# Patient Record
Sex: Male | Born: 1990 | Hispanic: Refuse to answer | Marital: Single | Smoking: Current every day smoker
Health system: Southern US, Community
[De-identification: ages and names within clinical notes are randomized; demographics above are authoritative.]

## PROBLEM LIST (undated history)

## (undated) DIAGNOSIS — Z8619 Personal history of other infectious and parasitic diseases: Secondary | ICD-10-CM

## (undated) HISTORY — DX: Personal history of other infectious and parasitic diseases: Z86.19

## (undated) HISTORY — PX: NO PAST SURGERIES: SHX2092

---
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
## 2008-06-18 ENCOUNTER — Ambulatory Visit: Payer: Self-pay | Admitting: Family Medicine

## 2011-01-17 ENCOUNTER — Encounter: Payer: Self-pay | Admitting: Internal Medicine

## 2011-01-17 ENCOUNTER — Other Ambulatory Visit (INDEPENDENT_AMBULATORY_CARE_PROVIDER_SITE_OTHER): Payer: 59

## 2011-01-17 ENCOUNTER — Ambulatory Visit (INDEPENDENT_AMBULATORY_CARE_PROVIDER_SITE_OTHER): Payer: 59 | Admitting: Internal Medicine

## 2011-01-17 VITALS — BP 98/60 | HR 90 | Temp 98.5°F | Ht 70.0 in | Wt 150.1 lb

## 2011-01-17 DIAGNOSIS — R51 Headache: Secondary | ICD-10-CM

## 2011-01-17 DIAGNOSIS — B349 Viral infection, unspecified: Secondary | ICD-10-CM

## 2011-01-17 DIAGNOSIS — IMO0001 Reserved for inherently not codable concepts without codable children: Secondary | ICD-10-CM

## 2011-01-17 DIAGNOSIS — B9789 Other viral agents as the cause of diseases classified elsewhere: Secondary | ICD-10-CM

## 2011-01-17 DIAGNOSIS — R110 Nausea: Secondary | ICD-10-CM

## 2011-01-17 DIAGNOSIS — M791 Myalgia, unspecified site: Secondary | ICD-10-CM

## 2011-01-17 DIAGNOSIS — Z720 Tobacco use: Secondary | ICD-10-CM

## 2011-01-17 DIAGNOSIS — F17200 Nicotine dependence, unspecified, uncomplicated: Secondary | ICD-10-CM

## 2011-01-17 LAB — BASIC METABOLIC PANEL
BUN: 8 mg/dL (ref 6–23)
Calcium: 9.3 mg/dL (ref 8.4–10.5)
Chloride: 104 mEq/L (ref 96–112)
Creatinine, Ser: 1.1 mg/dL (ref 0.4–1.5)
GFR: 89.84 mL/min (ref >60.00)

## 2011-01-17 LAB — CBC WITH DIFFERENTIAL/PLATELET
Basophils Absolute: 0 10*3/uL (ref 0.0–0.1)
Basophils Relative: 0.4 % (ref 0.0–3.0)
Eosinophils Absolute: 0.1 10*3/uL (ref 0.0–0.7)
Hemoglobin: 15.5 g/dL (ref 13.0–17.0)
Lymphocytes Relative: 37 % (ref 12.0–46.0)
Lymphs Abs: 1.6 10*3/uL (ref 0.7–4.0)
MCHC: 34.4 g/dL (ref 30.0–36.0)
MCV: 88.2 fl (ref 78.0–100.0)
Monocytes Absolute: 0.7 10*3/uL (ref 0.1–1.0)
Neutro Abs: 1.9 10*3/uL (ref 1.4–7.7)
RBC: 5.12 Mil/uL (ref 4.22–5.81)
RDW: 12.2 % (ref 11.5–14.6)

## 2011-01-17 LAB — HEPATIC FUNCTION PANEL
ALT: 24 U/L (ref 0–53)
Bilirubin, Direct: 0.1 mg/dL (ref 0.0–0.3)
Total Bilirubin: 0.4 mg/dL (ref 0.3–1.2)

## 2011-01-17 MED ORDER — IBUPROFEN 600 MG PO TABS: 600.0000 mg | ORAL_TABLET | Freq: Four times a day (QID) | ORAL | Status: AC | PRN

## 2011-01-17 NOTE — Patient Instructions (Signed)
It was good to see you today. Your symptoms are consistent with a viral syndrome - will check labs to include [place] and mono testing today. Your results will be called to you after review (48-72hours after test completion). If any changes need to be made, you will be notified at that time. For the aches and headache, use ibuprofen or Tylenol 4x/day as needed and stay hydrated! - Your prescription(s) have been submitted to your pharmacy. Please take as directed and contact our office if you believe you are having problem(s) with the medication(s). If your symptoms become worse or do not resolve in next 2 weeks, please call for further evaluation as needed STOP smoking - it is bad you and your loved ones!

## 2011-01-17 NOTE — Progress Notes (Signed)
Subjective:    Patient ID: Daniel Cantrell, male    DOB: 11-Sep-1990, 20 y.o.   MRN: [id number]  HPI New pt to me and our practice, here to establish care  complains of flu-like symptoms Onset 7-8 days ago associated with mild diffuse headache, myalgias in neck and extremities; also chills/subjective fever and nausea but no vomiting or bowel change Initially associated with sore throat but pharyngitis now resolved Symptom onset has been gradual and worsening for a time period of 1 week. Severity is described as moderate. Course of his symptoms over time is constant and worsening.  Denies chest pain, cough, shortness of breath or abdominal pain - no rash +mosquito exposure in past 2 weeks headache and myalgias improved but not resolved with tylenol or OTC NSAIDs   Past Medical History  Diagnosis Date  . History of chicken pox    Family History  Problem Relation Age of Onset  . Lung cancer Other     grandparent  . Alcohol abuse Mother   . Cancer Maternal Grandmother 80    lung, pancreatic   History  Substance Use Topics  . Smoking status: Current Everyday Smoker  . Smokeless tobacco: Not on file  . Alcohol Use: Yes    Review of Systems  Constitutional: Positive for fatigue.  HENT: Negative for ear pain, facial swelling and tinnitus.   Eyes: Negative for photophobia.  Genitourinary: Negative for dysuria and discharge.  Neurological: Negative for seizures and numbness.  also see HPI above.     Objective:   Physical Exam BP 98/60  Pulse 90  Temp(Src) 98.5 F (36.9 C) (Oral)  Ht 5\' 10"  (1.778 m)  Wt 150 lb 1.9 oz (68.094 kg)  BMI 21.54 kg/m2  SpO2 98%  Constitutional: thin, fit young man. appears well-developed and well-nourished. Fatigued but nontoxic.  HENT: NCAT, TMs B clear without erythema or effusion; OP with mild erythema, no exudate Eyes: PERRL, EOMI, no conjunctivitis or icterus Neck: Normal range of motion, supple, no meningismus - mild R>L anterior LAD.  No JVD present. No thyromegaly or tenderness present.  Cardiovascular: Normal rate, regular rhythm and normal heart sounds.  No murmur heard. no BLE edema. Pulmonary/Chest: Effort normal and breath sounds normal. No respiratory distress. no wheezes.  Abdominal: Soft. Bowel sounds are normal. Patient exhibits no distension. There is no tenderness. No mass or HSM Musculoskeletal: Normal range of motion.  No gross deformities Neurological: he is alert and oriented to person, place, and time. No cranial nerve deficit. Coordination and balance normal. Speech, cognition normal; DTRs 2+ BUE/LE Skin: Skin is warm and dry.  No erythema or ulceration. No rash. Psychiatric: he has a normal mood and affect. behavior is normal. Judgment and thought content normal.   No results found for this basename: WBC, HGB, HCT, PLT, CHOL, TRIG, HDL, LDLDIRECT, ALT, AST, NA, K, CL, CREATININE, BUN, CO2, TSH, PSA, INR, GLUF, HGBA1C, MICROALBUR        Assessment & Plan:  Viral syndrome with myalgias, mild headache and fever - outdoor exposure/mosquitos and seasonal epidemic raises concern for [place] infection -  Also consider mono or other viral infection - lab testing now, symptomatic tx and reassurance offered - hydrate, OTC NSAIDS and supportive care,  pt to call if headache or other symptoms worse in next 2 weeks  Tobacco abuse - 5 minutes today spent counseling patient on unhealthy effects of continued tobacco abuse and encouragement of cessation including medical options available to help the patient quit smoking.

## 2011-01-31 ENCOUNTER — Encounter: Payer: Self-pay | Admitting: Internal Medicine

## 2011-06-22 ENCOUNTER — Other Ambulatory Visit (INDEPENDENT_AMBULATORY_CARE_PROVIDER_SITE_OTHER): Payer: 59

## 2011-06-22 ENCOUNTER — Ambulatory Visit (INDEPENDENT_AMBULATORY_CARE_PROVIDER_SITE_OTHER): Payer: 59 | Admitting: Internal Medicine

## 2011-06-22 ENCOUNTER — Ambulatory Visit (HOSPITAL_COMMUNITY)
Admission: RE | Admit: 2011-06-22 | Discharge: 2011-06-22 | Disposition: A | Discharge status: Home / Self Care | Payer: 59 | Source: Ambulatory Visit | Attending: Internal Medicine | Admitting: Internal Medicine

## 2011-06-22 ENCOUNTER — Encounter: Payer: Self-pay | Admitting: Internal Medicine

## 2011-06-22 VITALS — BP 92/70 | HR 88 | Temp 97.6°F | Resp 16 | Wt 141.5 lb

## 2011-06-22 DIAGNOSIS — R1031 Right lower quadrant pain: Secondary | ICD-10-CM | POA: Insufficient documentation

## 2011-06-22 DIAGNOSIS — R112 Nausea with vomiting, unspecified: Secondary | ICD-10-CM

## 2011-06-22 LAB — CBC WITH DIFFERENTIAL/PLATELET
Basophils Relative: 0.3 % (ref 0.0–3.0)
Eosinophils Relative: 7.5 % — ABNORMAL HIGH (ref 0.0–5.0)
Hemoglobin: 16.3 g/dL (ref 13.0–17.0)
Lymphocytes Relative: 28.4 % (ref 12.0–46.0)
Neutro Abs: 3.8 10*3/uL (ref 1.4–7.7)
Neutrophils Relative %: 55.7 % (ref 43.0–77.0)
RBC: 5.38 Mil/uL (ref 4.22–5.81)
WBC: 6.9 10*3/uL (ref 4.5–10.5)

## 2011-06-22 LAB — BASIC METABOLIC PANEL
CO2: 28 mEq/L (ref 19–32)
Calcium: 9.6 mg/dL (ref 8.4–10.5)
Chloride: 104 mEq/L (ref 96–112)
Creatinine, Ser: 0.9 mg/dL (ref 0.4–1.5)
Sodium: 140 mEq/L (ref 135–145)

## 2011-06-22 LAB — HEPATIC FUNCTION PANEL
Albumin: 4.7 g/dL (ref 3.5–5.2)
Alkaline Phosphatase: 77 U/L (ref 39–117)
Total Protein: 7.4 g/dL (ref 6.0–8.3)

## 2011-06-22 MED ORDER — IOHEXOL 300 MG/ML  SOLN
80.0000 mL | Freq: Once | INTRAMUSCULAR | Status: AC | PRN
  Administered 2011-06-22: 80 mL via INTRAVENOUS

## 2011-06-22 MED ORDER — PROMETHAZINE HCL 25 MG PO TABS: 25.0000 mg | ORAL_TABLET | Freq: Four times a day (QID) | ORAL | Status: AC | PRN

## 2011-06-22 NOTE — Progress Notes (Signed)
  Subjective:    Patient ID: Daniel Cantrell, male    DOB: March 24, 1991, 21 y.o.   MRN: [id number]  HPI complains of RLQ pain Associated with nonstop nausea vomiting, poor oral tolerance Low-grade fever, no shaking chills Onset 3-4 days ago, unimproved since onset Initially diarrhea, no bowel movement in last 48 hours Sick contacts with viral URI but no gastroenteritis   Past Medical History  Diagnosis Date  . History of chicken pox     Review of Systems  Constitutional: Negative for unexpected weight change.  Respiratory: Negative for cough and shortness of breath.   Cardiovascular: Negative for chest pain and palpitations.  Gastrointestinal: Positive for nausea, vomiting and abdominal pain. Negative for diarrhea, constipation, blood in stool and abdominal distention.       Objective:   Physical Exam BP 92/70  Pulse 88  Temp(Src) 97.6 F (36.4 C) (Oral)  Resp 16  Wt 141 lb 8 oz (64.184 kg)  SpO2 98% Gen: Uncomfortable, thin young man - nontoxic but restless, question mild colic Lungs: Clear to auscultation bilaterally no increased work of breathing Or avascular: Regular rate and rhythm, no edema Abdomen: Flat, soft mildly tender over right side of the abdomen especially RLQ - hypoactive bowel sounds but present throughout, no rebound or guarding  Lab Results  Component Value Date   WBC 4.4* 01/17/2011   HGB 15.5 01/17/2011   HCT 45.1 01/17/2011   PLT 140.0* 01/17/2011   GLUCOSE 114* 01/17/2011   ALT 24 01/17/2011   AST 30 01/17/2011   NA 141 01/17/2011   K 4.2 01/17/2011   CL 104 01/17/2011   CREATININE 1.1 01/17/2011   BUN 8 01/17/2011   CO2 29 01/17/2011      Assessment & Plan:  RLQ pain with nausea and vomiting - ongoing greater than 72 hours -  Exam without acute abdomen - vital signs stable check labs, check CT abdomen pelvis rule out appendicitis and use Phenergan as needed for symptomatic control -

## 2011-06-22 NOTE — Patient Instructions (Signed)
It was good to see you today. Test(s) ordered today. Your results will be called to you after review (48-72hours after test completion). If any changes need to be made, you will be notified at that time. we'll make referral for CT scan to look for appendicitis today . Our office will contact you regarding appointment(s) once made. Use Phenergan as needed for nausea symptoms - Your prescription(s) have been submitted to your pharmacy. Please take as directed and contact our office if you believe you are having problem(s) with the medication(s). B.R.A.T. Diet Your doctor has recommended the B.R.A.T. diet for you or your child until the condition improves. This is often used to help control diarrhea and vomiting symptoms. If you or your child can tolerate clear liquids, you may have:  Bananas.     Rice.    Applesauce.    Toast (and other simple starches such as crackers, potatoes, noodles).  Be sure to avoid dairy products, meats, and fatty foods until symptoms are better. Fruit juices such as apple, grape, and prune juice can make diarrhea worse. Avoid these. Continue this diet for 2 days or as instructed by your caregiver. Document Released: 05/14/2005 Document Revised: 01/24/2011 Document Reviewed: 10/31/2006 [facility] Patient Information 2012 [place], [place].

## 2011-06-25 ENCOUNTER — Telehealth: Payer: Self-pay

## 2011-06-25 NOTE — Telephone Encounter (Signed)
Margaret Pyle, CMA 06/25/2011 8:52 AM Signed  [facility]  Triage Call Report  Triage Record Num: [id number] Operator: Amy Head  Patient Name: Daniel Cantrell Call Date & Time: 06/22/2011 11:01:15PM  Patient Phone: [phone] PCP: Rene Paci  Patient Gender: Male PCP Fax : [phone]  Patient DOB: May 16, 1991 Practice Name: [facility]  Reason for Call:  Caller: [place]/Mother; PCP: Rene Paci; CB#: [phone]; Call Reason: Abd  Pain, Onset 3 days aog and has gotten worse. Was seen in office today by Dr. Roseanna Rainbow. CT of  abd and pelvis with contrast and blood work done in office, but Have Not Gotten Results Yet.  Pain Getting Worse; Has had diarrhea X 1. Pain is to RLQ. Sx Onset: 06/22/2011; Sx Notes:  Walks bent over holding abd. Loss of appetite. ; Afebrile; Wt: ; Guideline Used: Abd Pain ;  Disp: ED; Appt Scheduled?: NO  Protocol(s) Used: Abdominal Pain  Recommended Outcome per Protocol: See ED Immediately  Reason for Outcome: Abdominal pain that has steadily worsened over  hours OR has been continuous for 3 hours or  more AND any of the following: loss of appetite,  vomiting starting after pain, any fever, OR unable  to carry out normal activities  Care Advice:  ~ Another adult should drive.  ~ Pain medication or laxatives should not be taken until symptoms are evaluated.  ~ Do not eat or drink anything until evaluated by provider.  Call EMS 911 if signs and symptoms of shock develop (such as unable to stand due to faintness, dizziness, or  lightheadedness; new onset of confusion; slow to respond or difficult to awaken; skin is pale, gray, cool, or moist to  touch; severe weakness; loss of consciousness).  ~  ~ IMMEDIATE ACTION  Write down provider's name. List or place the following in a bag for transport with the patient: current prescription  and/or nonprescription medications; alternative treatments, therapies and medications; and street drugs.  ~    06/22/2011 11:11:15PM Page 1 of 1 [email or web address]

## 2011-06-25 NOTE — Telephone Encounter (Signed)
Left message on machine for pt to return my call  

## 2011-06-25 NOTE — Telephone Encounter (Signed)
noted. No emergency room visits made over weekend. Please contact patient - his labs and CT scan were all normal. If continued pain or problems despite Phenergan, b no and I will send other medications. Thanks

## 2011-06-26 NOTE — Telephone Encounter (Signed)
Pt's father advised of results.

## 2012-03-17 ENCOUNTER — Telehealth (HOSPITAL_COMMUNITY): Payer: Self-pay | Admitting: Psychiatry

## 2012-03-18 NOTE — Telephone Encounter (Signed)
Erroneous encounter

## 2013-01-21 ENCOUNTER — Encounter: Payer: Self-pay | Admitting: Internal Medicine

## 2013-01-21 ENCOUNTER — Ambulatory Visit (INDEPENDENT_AMBULATORY_CARE_PROVIDER_SITE_OTHER): Payer: 59 | Admitting: Internal Medicine

## 2013-01-21 VITALS — BP 120/84 | HR 109 | Temp 98.6°F | Wt 154.0 lb

## 2013-01-21 DIAGNOSIS — R110 Nausea: Secondary | ICD-10-CM

## 2013-01-21 DIAGNOSIS — R109 Unspecified abdominal pain: Secondary | ICD-10-CM

## 2013-01-21 DIAGNOSIS — L290 Pruritus ani: Secondary | ICD-10-CM

## 2013-01-21 DIAGNOSIS — B80 Enterobiasis: Secondary | ICD-10-CM

## 2013-01-21 MED ORDER — ALBENDAZOLE 200 MG PO TABS: ORAL_TABLET | ORAL | Status: DC

## 2013-01-21 NOTE — Progress Notes (Signed)
Subjective:    Patient ID: Daniel Cantrell, male    DOB: 20-May-1991, 22 y.o.   MRN: [id number]  HPI  Pt presents to the clinic today with c/o nausea and abdominal pain x 4 days. He has had trouble eating and sleeping due to the pain. The pain is not localized it is just generalized. He denies fever, chills, body aches, vomiting or diarrhea. He does report the he also has anal itching and at one time, he did pull a very thin 1/2 inch white worm out of his anus. He has never had anything like this before. He has not traveled to any 3rd world country. He has not drinking unpurified water that he is aware of.  Review of Systems  Past Medical History  Diagnosis Date  . History of chicken pox     No current outpatient prescriptions on file.   No current facility-administered medications for this visit.    No Known Allergies  Family History  Problem Relation Age of Onset  . Lung cancer Other     grandparent  . Alcohol abuse Mother   . Cancer Maternal Grandmother 80    lung, pancreatic    History   Social History  . Marital Status: Single    Spouse Name: N/A    Number of Children: N/A  . Years of Education: N/A   Occupational History  . Not on file.   Social History Main Topics  . Smoking status: Current Every Day Smoker  . Smokeless tobacco: Not on file  . Alcohol Use: Yes  . Drug Use: No  . Sexual Activity: Not on file   Other Topics Concern  . Not on file   Social History Narrative  . No narrative on file     Constitutional: Denies fever, malaise, fatigue, headache or abrupt weight changes.  Gastrointestinal: Pt reports anal itching, nausea and abdominal pain. Denies  bloating, constipation, diarrhea or blood in the stool.  Skin: Denies redness, rashes, lesions or ulcercations.   No other specific complaints in a complete review of systems (except as listed in HPI above).     Objective:   Physical Exam  BP 120/84  Pulse 109  Temp(Src) 98.6 F (37 C)  (Oral)  Wt 154 lb (69.854 kg)  BMI 22.1 kg/m2  SpO2 97% Wt Readings from Last 3 Encounters:  01/21/13 154 lb (69.854 kg)  06/22/11 141 lb 8 oz (64.184 kg)  01/17/11 150 lb 1.9 oz (68.094 kg)    General: Appears his stated age, well developed, well nourished in NAD. Cardiovascular: Normal rate and rhythm. S1,S2 noted.  No murmur, rubs or gallops noted. No JVD or BLE edema. No carotid bruits noted. Pulmonary/Chest: Normal effort and positive vesicular breath sounds. No respiratory distress. No wheezes, rales or ronchi noted.  Abdomen: Soft and nontender. Normal bowel sounds, no bruits noted. No distention or masses noted. Liver, spleen and kidneys non palpable. Anus normal, no s/s of pinworms. Psychiatric: Mood anxious and affect normal. Behavior is normal. Judgment and thought content normal.   EKG:  BMET    Component Value Date/Time   NA 140 06/22/2011 1336   K 4.4 06/22/2011 1336   CL 104 06/22/2011 1336   CO2 28 06/22/2011 1336   GLUCOSE 89 06/22/2011 1336   BUN 10 06/22/2011 1336   CREATININE 0.9 06/22/2011 1336   CALCIUM 9.6 06/22/2011 1336    Lipid Panel  No results found for this basename: chol, trig, hdl, cholhdl, vldl, ldlcalc  CBC    Component Value Date/Time   WBC 6.9 06/22/2011 1336   RBC 5.38 06/22/2011 1336   HGB 16.3 06/22/2011 1336   HCT 47.7 06/22/2011 1336   PLT 171.0 06/22/2011 1336   MCV 88.7 06/22/2011 1336   MCHC 34.3 06/22/2011 1336   RDW 12.3 06/22/2011 1336   LYMPHSABS 1.9 06/22/2011 1336   MONOABS 0.6 06/22/2011 1336   EOSABS 0.5 06/22/2011 1336   BASOSABS 0.0 06/22/2011 1336    Hgb A1C No results found for this basename: HGBA1C         Assessment & Plan:   Anal itching, not certain that it is related to pinworms:  Will treat for pinworms as pt is very anxious about this Benadryl as needed at night Tylenol prn for pain  Monitor symptoms, if the persist or worse or you develop diarrhea or fever, RTC.

## 2013-01-21 NOTE — Patient Instructions (Signed)
Pinworms  Your caregiver has diagnosed you as having pinworms. These are common infections of children and less common in adults. Pinworms are a small white worm less one quarter to a half inch in length. They look like a tiny piece of white thread. A person gets pinworms by swallowing the eggs of the worm. These eggs are obtained from contaminated (infected or tainted) food, clothing, toys, or any object that comes in contact with the body and mouth. The eggs hatch in the small bowel (intestine) and quickly develop into adult worms in the large bowel (colon). The male worm develops in the large intestine for about two to four weeks. It lays eggs around the anus during the night. These eggs then contaminate clothing, fingers, bedding, and anything else they come in contact with. The main symptoms (problems) of pinworms are itching around the anus (pruritus ani) at night. Children may also have occasional abdominal (belly) pain, loss of appetite, problems sleeping, and irritability. If you or your child has continual anal itching at night, that is a good sign to consult your caregiver. Just about everybody at some time in their life has acquired pinworms. Getting them has nothing to do with the cleanliness of your household or your personal hygiene. Complications are uncommon.  DIAGNOSIS   Diagnosis can be made by looking at your child's anus at night when the pinworms are laying eggs or by sticking a piece of scotch tape on the anus in the morning. The eggs will stick to the tape. This can be examined by your caregiver who can make a diagnosis by looking at the tape under a microscope. Sometimes several scotch tape swabs will be necessary.   HOME CARE INSTRUCTIONS   · Your caregiver will give you medications. They should be taken as directed. Eggs are easily passed. The whole family often needs treatment even if no symptoms are present. Several treatments may be necessary. A second treatment is usually needed  after two weeks to a month.  · Maintain strict hygiene. Washing hands often and keeping the nails short is helpful. Children often scratch themselves at night in their sleep so the eggs get under the nail. This causes reinfection by hand to mouth contamination.  · Change bedding and clothing daily. These should be washed in hot water and dried. This kills the eggs and stops the life cycle of the worm.  · Pets are not known to carry pinworms.  · An ointment may be used at night for anal itching.  · See your caregiver if problems continue.  Document Released: 05/11/2000 Document Revised: 08/06/2011 Document Reviewed: 05/11/2008  [employer]® Patient Information ©2014 [employer].

## 2013-03-23 ENCOUNTER — Other Ambulatory Visit (INDEPENDENT_AMBULATORY_CARE_PROVIDER_SITE_OTHER): Payer: 59

## 2013-03-23 ENCOUNTER — Ambulatory Visit (INDEPENDENT_AMBULATORY_CARE_PROVIDER_SITE_OTHER): Payer: 59 | Admitting: Internal Medicine

## 2013-03-23 ENCOUNTER — Encounter: Payer: Self-pay | Admitting: Internal Medicine

## 2013-03-23 VITALS — BP 112/62 | HR 86 | Temp 97.8°F | Ht 70.0 in | Wt 158.8 lb

## 2013-03-23 DIAGNOSIS — Z0000 Encounter for general adult medical examination without abnormal findings: Secondary | ICD-10-CM

## 2013-03-23 LAB — CBC WITH DIFFERENTIAL/PLATELET
Basophils Absolute: 0 10*3/uL (ref 0.0–0.1)
Basophils Relative: 0.5 % (ref 0.0–3.0)
Eosinophils Absolute: 0.1 10*3/uL (ref 0.0–0.7)
Eosinophils Relative: 1.2 % (ref 0.0–5.0)
HCT: 44 % (ref 39.0–52.0)
Hemoglobin: 15.3 g/dL (ref 13.0–17.0)
Lymphocytes Relative: 23.4 % (ref 12.0–46.0)
Lymphs Abs: 1.4 10*3/uL (ref 0.7–4.0)
MCHC: 34.7 g/dL (ref 30.0–36.0)
MCV: 88.4 fl (ref 78.0–100.0)
Monocytes Absolute: 0.7 10*3/uL (ref 0.1–1.0)
Monocytes Relative: 10.9 % (ref 3.0–12.0)
Neutro Abs: 3.9 10*3/uL (ref 1.4–7.7)
Neutrophils Relative %: 64 % (ref 43.0–77.0)
Platelets: 181 10*3/uL (ref 150.0–400.0)
RBC: 4.98 Mil/uL (ref 4.22–5.81)
RDW: 12.3 % (ref 11.5–14.6)
WBC: 6.1 10*3/uL (ref 4.5–10.5)

## 2013-03-23 LAB — HEPATIC FUNCTION PANEL
ALT: 19 U/L (ref 0–53)
AST: 25 U/L (ref 0–37)
Albumin: 4.7 g/dL (ref 3.5–5.2)
Alkaline Phosphatase: 73 U/L (ref 39–117)
Bilirubin, Direct: 0.1 mg/dL (ref 0.0–0.3)
Total Bilirubin: 0.7 mg/dL (ref 0.3–1.2)
Total Protein: 7.4 g/dL (ref 6.0–8.3)

## 2013-03-23 LAB — BASIC METABOLIC PANEL
BUN: 8 mg/dL (ref 6–23)
CO2: 29 mEq/L (ref 19–32)
Calcium: 9.1 mg/dL (ref 8.4–10.5)
Glucose, Bld: 107 mg/dL — ABNORMAL HIGH (ref 70–99)
Sodium: 140 mEq/L (ref 135–145)

## 2013-03-23 LAB — URINALYSIS, ROUTINE W REFLEX MICROSCOPIC
Bilirubin Urine: NEGATIVE
Ketones, ur: NEGATIVE
Leukocytes, UA: NEGATIVE
Urobilinogen, UA: 0.2 (ref 0.0–1.0)

## 2013-03-23 LAB — LIPID PANEL
Cholesterol: 172 mg/dL (ref 0–200)
HDL: 51.3 mg/dL
LDL Cholesterol: 98 mg/dL (ref 0–99)
Total CHOL/HDL Ratio: 3
Triglycerides: 114 mg/dL (ref 0.0–149.0)
VLDL: 22.8 mg/dL (ref 0.0–40.0)

## 2013-03-23 LAB — TSH: TSH: 1.62 u[IU]/mL (ref 0.35–5.50)

## 2013-03-23 NOTE — Patient Instructions (Addendum)
It was good to see you today.  Health Maintenance reviewed - all recommended immunizations and age-appropriate screenings are up-to-date. Consider Gardisil for HPV prevention as discussed to reduce risk anal and rectal cancer  Test(s) ordered today. Your results will be released to MyChart (or called to you) after review, usually within 72hours after test completion. If any changes need to be made, you will be notified at that same time.  Medications reviewed and updated, no changes recommended at this time.  Please schedule followup in 2 years, call sooner if problems.  Continue to think about giving up cigarettes! Use nicotine gum, nicotine patches or electronic cigarettes. If you're interested in medication to help you quit, please call  - let me know how I can help!    Health Maintenance, Males A healthy lifestyle and preventative care can promote health and wellness.  Maintain regular health, dental, and eye exams.  Eat a healthy diet. Foods like vegetables, fruits, whole grains, low-fat dairy products, and lean protein foods contain the nutrients you need without too many calories. Decrease your intake of foods high in solid fats, added sugars, and salt. Get information about a proper diet from your caregiver, if necessary.  Regular physical exercise is one of the most important things you can do for your health. Most adults should get at least 150 minutes of moderate-intensity exercise (any activity that increases your heart rate and causes you to sweat) each week. In addition, most adults need muscle-strengthening exercises on 2 or more days a week.   Maintain a healthy weight. The body mass index (BMI) is a screening tool to identify possible weight problems. It provides an estimate of body fat based on height and weight. Your caregiver can help determine your BMI, and can help you achieve or maintain a healthy weight. For adults 20 years and older:  A BMI below 18.5 is considered  underweight.  A BMI of 18.5 to 24.9 is normal.  A BMI of 25 to 29.9 is considered overweight.  A BMI of 30 and above is considered obese.  Maintain normal blood lipids and cholesterol by exercising and minimizing your intake of saturated fat. Eat a balanced diet with plenty of fruits and vegetables. Blood tests for lipids and cholesterol should begin at age 57 and be repeated every 5 years. If your lipid or cholesterol levels are high, you are over 50, or you are a high risk for heart disease, you may need your cholesterol levels checked more frequently.Ongoing high lipid and cholesterol levels should be treated with medicines, if diet and exercise are not effective.  If you smoke, find out from your caregiver how to quit. If you do not use tobacco, do not start.  If you choose to drink alcohol, do not exceed 2 drinks per day. One drink is considered to be 12 ounces (355 mL) of beer, 5 ounces (148 mL) of wine, or 1.5 ounces (44 mL) of liquor.  Avoid use of street drugs. Do not share needles with anyone. Ask for help if you need support or instructions about stopping the use of drugs.  High blood pressure causes heart disease and increases the risk of stroke. Blood pressure should be checked at least every 1 to 2 years. Ongoing high blood pressure should be treated with medicines if weight loss and exercise are not effective.  If you are 90 to 24 years old, ask your caregiver if you should take aspirin to prevent heart disease.  Diabetes screening involves taking  a blood sample to check your fasting blood sugar level. This should be done once every 3 years, after age 54, if you are within normal weight and without risk factors for diabetes. Testing should be considered at a younger age or be carried out more frequently if you are overweight and have at least 1 risk factor for diabetes.  Colorectal cancer can be detected and often prevented. Most routine colorectal cancer screening begins at the  age of 65 and continues through age 31. However, your caregiver may recommend screening at an earlier age if you have risk factors for colon cancer. On a yearly basis, your caregiver may provide home test kits to check for hidden blood in the stool. Use of a small camera at the end of a tube, to directly examine the colon (sigmoidoscopy or colonoscopy), can detect the earliest forms of colorectal cancer. Talk to your caregiver about this at age 20, when routine screening begins. Direct examination of the colon should be repeated every 5 to 10 years through age 46, unless early forms of pre-cancerous polyps or small growths are found.  Hepatitis C blood testing is recommended for all people born from [birth year] through 1967 and any individual with known risks for hepatitis C.  Healthy men should no longer receive prostate-specific antigen (PSA) blood tests as part of routine cancer screening. Consult with your caregiver about prostate cancer screening.  Testicular cancer screening is not recommended for adolescents or adult males who have no symptoms. Screening includes self-exam, caregiver exam, and other screening tests. Consult with your caregiver about any symptoms you have or any concerns you have about testicular cancer.  Practice safe sex. Use condoms and avoid high-risk sexual practices to reduce the spread of sexually transmitted infections (STIs).  Use sunscreen with a sun protection factor (SPF) of 30 or greater. Apply sunscreen liberally and repeatedly throughout the day. You should seek shade when your shadow is shorter than you. Protect yourself by wearing long sleeves, pants, a wide-brimmed hat, and sunglasses year round, whenever you are outdoors.  Notify your caregiver of new moles or changes in moles, especially if there is a change in shape or color. Also notify your caregiver if a mole is larger than the size of a pencil eraser.  A one-time screening for abdominal aortic aneurysm (AAA) and  surgical repair of large AAAs by sound wave imaging (ultrasonography) is recommended for ages 29 to 49 years who are current or former smokers.  Stay current with your immunizations. Document Released: 11/10/2007 Document Revised: 08/06/2011 Document Reviewed: 10/09/2010 [facility] Patient Information 2014 [place], [place]. HPV Vaccine Questions and Answers WHAT IS HUMAN PAPILLOMAVIRUS (HPV)? HPV is a virus that can lead to cervical cancer; vulvar and vaginal cancers; penile cancer; anal cancer and genital warts (warts in the genital areas). More than 1 vaccine is available to help you or your child with protection against HPV. Your caregiver can talk to you about which one might give you the best protection. WHO SHOULD GET THIS VACCINE? The HPV vaccine is most effective when given before the onset of sexual activity.  This vaccine is recommended for girls 8 or 24 years of age. It can be given to girls as young as 24 years old.  HPV vaccine can be given to males, 9 through 24 years of age, to reduce the likelihood of acquiring genital warts.  HPV vaccine can be given to males and females aged 55 through 26 years to prevent anal cancer. HPV vaccine is  not generally recommended after age 80, because most individuals have been exposed to the HPV virus by that age. HOW EFFECTIVE IS THIS VACCINE?  The vaccine is generally effective in preventing cervical; vulvar and vaginal cancers; penile cancer; anal cancer and genital warts caused by 4 types of HPV. The vaccine is less effective in those individuals who are already infected with HPV. This vaccine does not treat existing HPV, genital warts, pre-cancers or cancers. WILL SEXUALLY ACTIVE INDIVIDUALS BENEFIT FROM THE VACCINE? Sexually active individuals may still benefit from the vaccine but may get less benefit due to previous HPV exposure. HOW AND WHEN IS THE VACCINE ADMINISTERED? The vaccine is given in a series of 3 injections (shots) over a 6 month  period in both males and females. The exact timing depends on which specific vaccine your caregiver recommends for you. IS THE HPV VACCINE SAFE?  The federal government has approved the HPV vaccine as safe and effective. This vaccine was tested in both males and females in many countries around the world. The most common side effect is soreness at the injection site. Since the drug became approved, there has been some concern about patients passing out after being vaccinated, which has led to a recommendation of a 15 minute waiting period following vaccination. This practice may decrease the small risk of passing out. Additionally there is a rare risk of anaphylaxis (an allergic reaction) to the vaccine and a risk of a blood clot among individuals with specific risk factors for a blood clot. DOES THIS VACCINE CONTAIN THIMEROSAL OR MERCURY? No. There is no thimerosal or mercury in the HPV vaccine. It is made of proteins from the outer coat of the virus (HPV). There is no infectious material in this vaccine. WILL GIRLS/WOMEN WHO HAVE BEEN VACCINATED STILL NEED CERVICAL CANCER SCREENING? Yes. There are 3 reasons why women will still need regular cervical cancer screening. First, the vaccine will NOT provide protection against all types of HPV that cause cervical cancer. Vaccinated women will still be at risk for some cancers. Second, some women may not get all required doses of the vaccine (or they may not get them at the recommended times). Therefore, they may not get the vaccine's full benefits. Third, women may not get the full benefit of the vaccine if they receive it after they have already acquired any of the 4 types of HPV. WILL THE HPV VACCINE BE COVERED BY INSURANCE PLANS? While some insurance companies may cover the vaccine, others may not. Most large group insurance plans cover the costs of recommended vaccines. WHAT KIND OF GOVERNMENT PROGRAMS MAY BE AVAILABLE TO COVER HPV VACCINE? Federal health  programs such as Vaccines for Children [facility]) will cover the HPV vaccine. The [facility] program provides free vaccines to children and adolescents under 86 years of age, who are either uninsured, Medicaid-eligible, American [place] or [place] Native. There are over 45,000 sites that provide [facility] vaccines including hospital, private and public clinics. The [facility] program also allows children and adolescents to get VFC vaccines through [facility] or [facility] if their private health insurance does not cover the vaccine. Some states also provide free or low-cost vaccines, at public health clinics, to people without health insurance coverage for vaccines. GENITAL HPV: WHY IS HPV IMPORTANT? Genital HPV is the most common virus transmitted through genital contact, most often during vaginal and anal sex. About 40 types of HPV can infect the genital areas of men and women. While most HPV types  cause no symptoms and go away on their own, some types can cause cervical cancer in women. These types also cause other less common genital cancers, including cancers of the penis, anus, vagina (birth canal), and vulva (area around the opening of the vagina). Other types of HPV can cause genital warts in men and women. HOW COMMON IS HPV?   At least 50% of sexually active people will get HPV at some time in their lives. HPV is most common in young women and men who are in their late teens and early 29s.  Anyone who has ever had genital contact with another person can get HPV. Both men and women can get it and pass it on to their sex partners without realizing it. IS HPV THE SAME THING AS HIV OR HERPES? HPV is NOT the same as HIV or Herpes (Herpes simplex virus or HSV). While these are all viruses that can be sexually transmitted, HIV and HSV do not cause the same symptoms or health problems as HPV. CAN HPV AND ITS ASSOCIATED DISEASES BE TREATED? There is no treatment for HPV. There are treatments for  the health problems that HPV can cause, such as genital warts, cervical cell changes, and cancers of the cervix (lower part of the womb), vulva, vagina and anus.  HOW IS HPV RELATED TO CERVICAL CANCER? Some types of HPV can infect a woman's cervix and cause the cells to change in an abnormal way. Most of the time, HPV goes away on its own. When HPV is gone, the cervical cells go back to normal. Sometimes, HPV does not go away. Instead, it lingers (persists) and continues to change the cells on a woman's cervix. These cell changes can lead to cancer over time if they are not treated. ARE THERE OTHER WAYS TO PREVENT CERVICAL CANCER? Regular Pap tests and follow-up can prevent most, but not all, cases of cervical cancer. Pap tests can detect cell changes (or pre-cancers) in the cervix before they turn into cancer. Pap tests can also detect most, but not all, cervical cancers at an early, curable stage. Most women diagnosed with cervical cancer have either never had a Pap test, or not had a Pap test in the last 5 years. There is also an HPV DNA test available for use with the Pap test as part of cervical cancer screening. This test may be ordered for women over 30 or for women who get an unclear (borderline) Pap test result. While this test can tell if a woman has HPV on her cervix, it cannot tell which types of HPV she has. If the HPV DNA test is negative for HPV DNA, then screening may be done every 3 years. If the HPV DNA test is positive for HPV DNA, then screening should be done every 6 to 12 months. OTHER QUESTIONS ABOUT THE HPV VACCINE WHAT HPV TYPES DOES THE VACCINE PROTECT AGAINST? The HPV vaccine protects against the HPV types that cause most (70%) cervical cancers (types 16 and 18), most (78%) anal cancers (types 16 and 18) and the two HPV types that cause most (90%) genital warts (types 6 and 11). WHAT DOES THE VACCINE NOT PROTECT AGAINST?  Because the vaccine does not protect against all types of  HPV, it will not prevent all cases of cervical cancer, anal cancer, other genital cancers or genital warts. About 30% of cervical cancers are not prevented with vaccination, so it will be important for women to continue screening for cervical cancer (regular Pap  tests). Also, the vaccine does not prevent about 10% of genital warts nor will it prevent other sexually transmitted infections (STIs), including HIV. Therefore, it will still be important for sexually active adults to practice safe sex to reduce exposure to HPV and other STI's. HOW LONG DOES VACCINE PROTECTION LAST? WILL A BOOSTER SHOT BE NEEDED? So far, studies have followed women for 5 years and found that they are still protected. Currently, additional (booster) doses are not recommended. More research is being done to find out how long protection will last, and if a booster vaccine is needed years later.  WHY IS THE HPV VACCINE RECOMMENDED AT SUCH A YOUNG AGE? Ideally, males and females should get the vaccine before they are sexually active since this vaccine is most effective in individuals who have not yet acquired any of the HPV vaccine types. Individuals who have not been infected with any of the 4 types of HPV will get the full benefits of the vaccine.  SHOULD PREGNANT WOMEN BE VACCINATED? The vaccine is not recommended for pregnant women. There has been limited research looking at vaccine safety for pregnant women and their developing fetus. Studies suggest that the vaccine has not caused health problems during pregnancy, nor has it caused health problems for the infant. Pregnant women should complete their pregnancy before getting the vaccine. If a woman finds out she is pregnant after she has started getting the vaccine series, she should complete her pregnancy before finishing the 3 doses. SHOULD BREASTFEEDING MOTHERS BE VACCINATED? Mothers nursing their babies may get the vaccine because the virus is inactivated and will not harm the  mother or baby. WILL INDIVIDUALS BE PROTECTED AGAINST HPV AND RELATED DISEASES, EVEN IF THEY DO NOT GET ALL 3 DOSES? It is not yet known how much protection individuals will get from receiving only 1 or 2 doses of the vaccine. For this reason, it is very important that individuals get all 3 doses of the vaccine. WILL CHILDREN BE REQUIRED TO BE VACCINATED TO ENTER SCHOOL? There are no federal laws that require children or adolescents to get vaccinated. All school entry laws are state laws so they vary from state to state. To find out what vaccines are needed for children or adolescents to enter school in your state, check with your state health department or board of education. ARE THERE OTHER WAYS TO PREVENT HPV? The only sure way to prevent HPV is to abstain from all sexual activity. Sexually active adults can reduce their risk by being in a mutually monogamous relationship with someone who has had no other sex partners. But even individuals with only 1 lifetime sex partner can get HPV, if their partner has had a previous partner with HPV. It is unknown how much protection condoms provide against HPV, since areas that are not covered by a condom can be exposed to the virus. However, condoms may reduce the risk of genital warts and cervical cancer. They can also reduce the risk of HIV and some other sexually transmitted infections (STIs), when used consistently and correctly (all the time and the right way). Document Released: 05/14/2005 Document Revised: 08/06/2011 Document Reviewed: 01/07/2009 [facility] Patient Information 2014 [place], [place].

## 2013-03-23 NOTE — Progress Notes (Signed)
  Subjective:    Patient ID: Daniel Cantrell, male    DOB: 1991-01-05, 22 y.o.   MRN: [id number]  HPI  patient is here today for annual physical. Patient feels well and has no complaints.  Past Medical History  Diagnosis Date  . History of chicken pox    Family History  Problem Relation Age of Onset  . Lung cancer Other     grandparent  . Alcohol abuse Mother   . Cancer Maternal Grandmother 80    lung, pancreatic   History  Substance Use Topics  . Smoking status: Current Every Day Smoker  . Smokeless tobacco: Not on file  . Alcohol Use: Yes    Review of Systems  Constitutional: Negative for fever, activity change, appetite change, fatigue and unexpected weight change.  Respiratory: Negative for cough, chest tightness, shortness of breath and wheezing.   Cardiovascular: Negative for chest pain, palpitations and leg swelling.  Neurological: Negative for dizziness, weakness and headaches.  Psychiatric/Behavioral: Negative for dysphoric mood. The patient is not nervous/anxious.   All other systems reviewed and are negative.       Objective:   Physical Exam BP 112/62  Pulse 86  Temp(Src) 97.8 F (36.6 C) (Oral)  Ht 5\' 10"  (1.778 m)  Wt 158 lb 12.8 oz (72.031 kg)  BMI 22.79 kg/m2  SpO2 98% Wt Readings from Last 3 Encounters:  03/23/13 158 lb 12.8 oz (72.031 kg)  01/21/13 154 lb (69.854 kg)  06/22/11 141 lb 8 oz (64.184 kg)   Constitutional: he is trim and fit, appears well-developed and well-nourished. No distress.  HENT: Head: Normocephalic and atraumatic. Ears: B TMs ok, no erythema or effusion; Nose: Nose normal. Mouth/Throat: Oropharynx is clear and moist. No oropharyngeal exudate.  Eyes: Conjunctivae and EOM are normal. Pupils are equal, round, and reactive to light. No scleral icterus.  Neck: Normal range of motion. Neck supple. No JVD present. No thyromegaly present.  Cardiovascular: Normal rate, regular rhythm and normal heart sounds.  No murmur heard. No BLE  edema. Pulmonary/Chest: Effort normal and breath sounds normal. No respiratory distress. he has no wheezes.  Abdominal: Soft. Bowel sounds are normal. he exhibits no distension. There is no tenderness. no masses Musculoskeletal: Normal range of motion, no joint effusions. No gross deformities Neurological: he is alert and oriented to person, place, and time. No cranial nerve deficit. Coordination, balance, strength, speech and gait are normal.  Skin: Skin is warm and dry. No rash noted. No erythema.  Psychiatric: he has a normal mood and affect. behavior is normal. Judgment and thought content normal.   Lab Results  Component Value Date   WBC 6.9 06/22/2011   HGB 16.3 06/22/2011   HCT 47.7 06/22/2011   PLT 171.0 06/22/2011   GLUCOSE 89 06/22/2011   ALT 21 06/22/2011   AST 20 06/22/2011   NA 140 06/22/2011   K 4.4 06/22/2011   CL 104 06/22/2011   CREATININE 0.9 06/22/2011   BUN 10 06/22/2011   CO2 28 06/22/2011       Assessment & Plan:   CPX/v70.0 - Patient has been counseled on age-appropriate routine health concerns for screening and prevention. These are reviewed and up-to-date. Immunizations are up-to-date or declined. Labs ordered and reviewed.  Also See problem list. Medications and labs reviewed today.

## 2013-03-24 ENCOUNTER — Encounter: Payer: Self-pay | Admitting: Internal Medicine

## 2013-04-07 IMAGING — CT CT ABD-PELV W/ CM
2 of 4 series · 17 of 46 positions shown, 19 images · IV contrast (APPLIED)
Comparison: None

CLINICAL DATA: Right lower quadrant abdominal and pelvic pain
question appendicitis

CT ABDOMEN AND PELVIS WITH CONTRAST
TECHNIQUE: Multidetector CT imaging of the abdomen and pelvis was
performed following the standard protocol during bolus
administration of intravenous contrast. Sagittal and coronal MPR
images reconstructed from axial data set.
Contrast: 80mL OMNIPAQUE IOHEXOL 300 MG/ML IV SOLN Dilute oral
contrast.

[Series 2: rtn a/p with · axial · 0.74mm/px · z∈[-606,-241]mm · 14 of 81 slices shown, 16 images]
[im 4/81  soft-tissue]
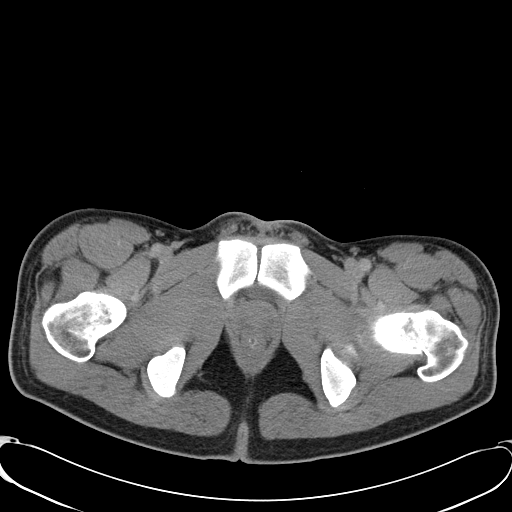
[im 4/81  bone]
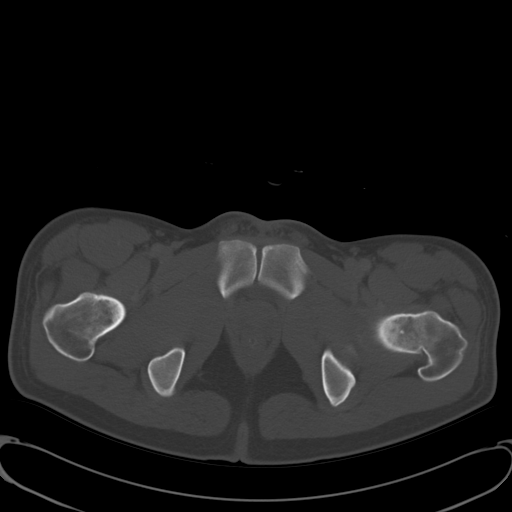
[im 10/81  soft-tissue]
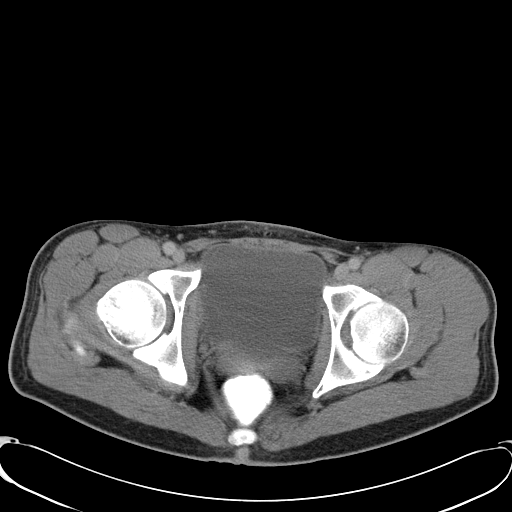
[im 17/81  soft-tissue]
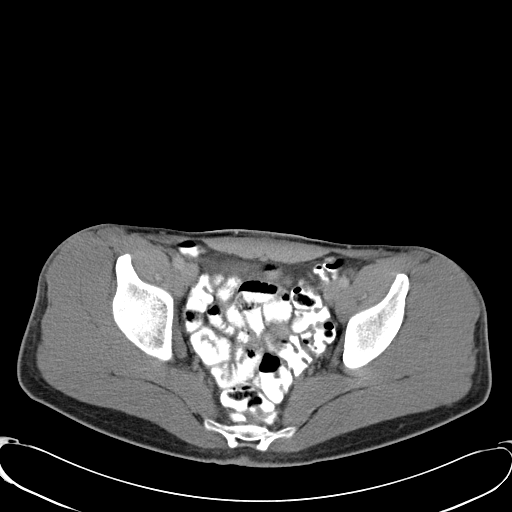
[im 23/81  soft-tissue]
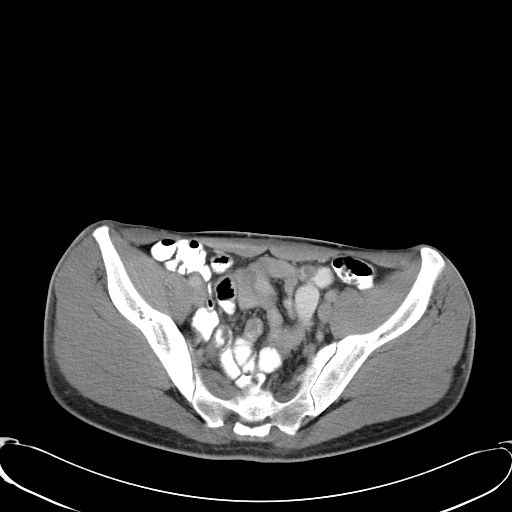
[im 26/81  soft-tissue]
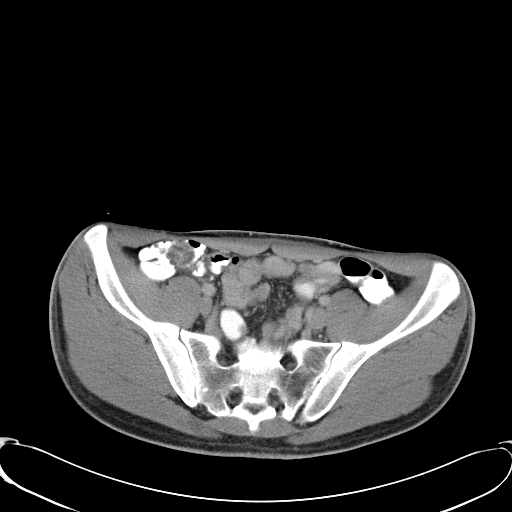
[im 33/81  soft-tissue]
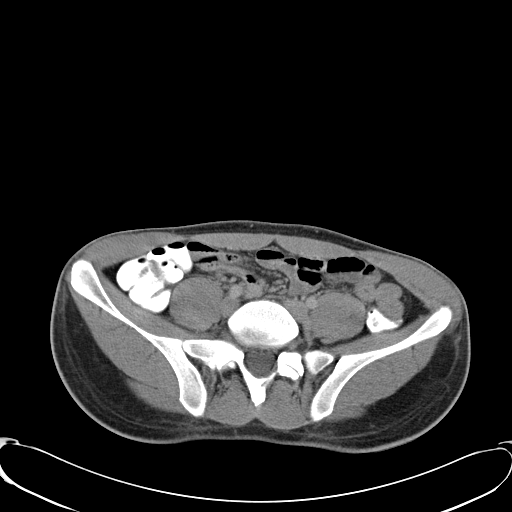
[im 39/81  soft-tissue]
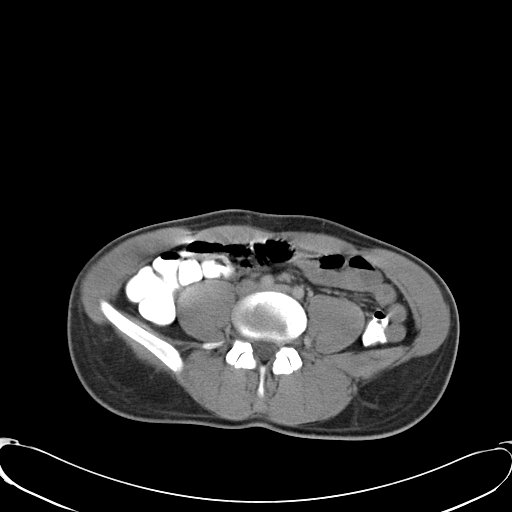
[im 42/81  soft-tissue]
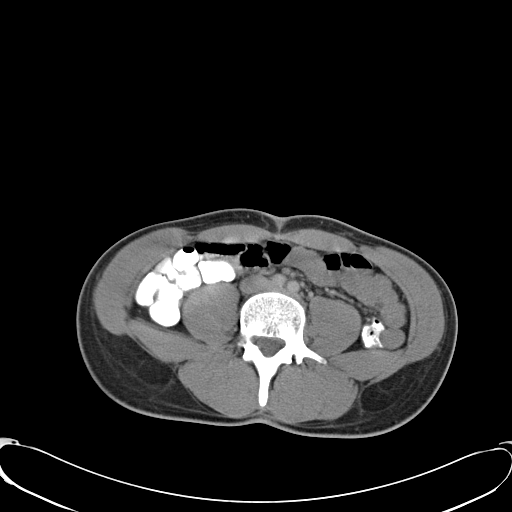
[im 49/81  soft-tissue]
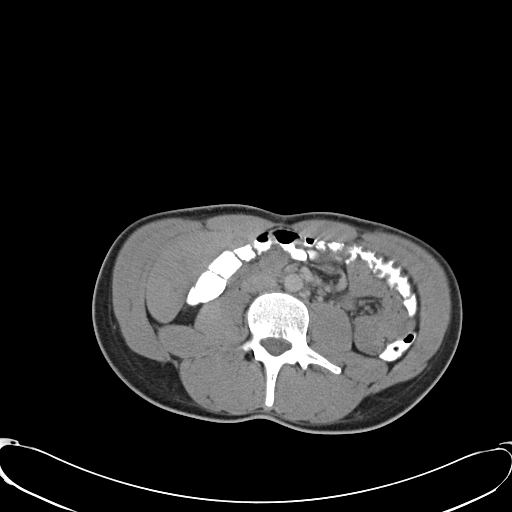
[im 49/81  bone]
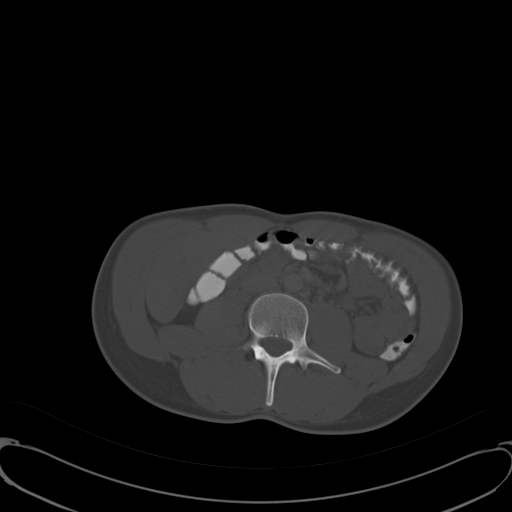
[im 55/81  soft-tissue]
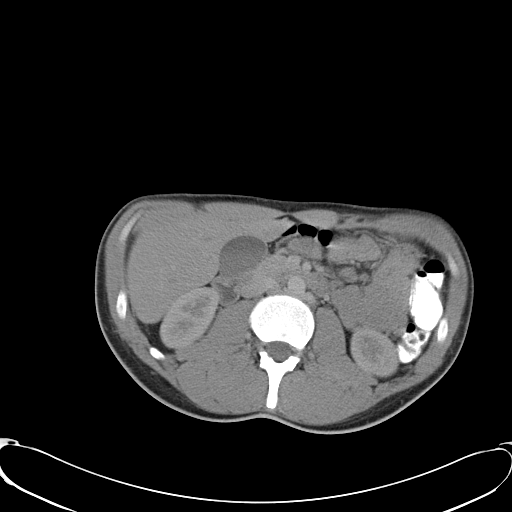
[im 61/81  soft-tissue]
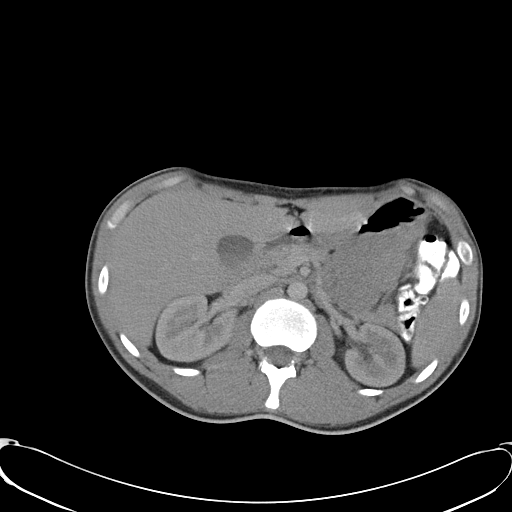
[im 65/81  soft-tissue]
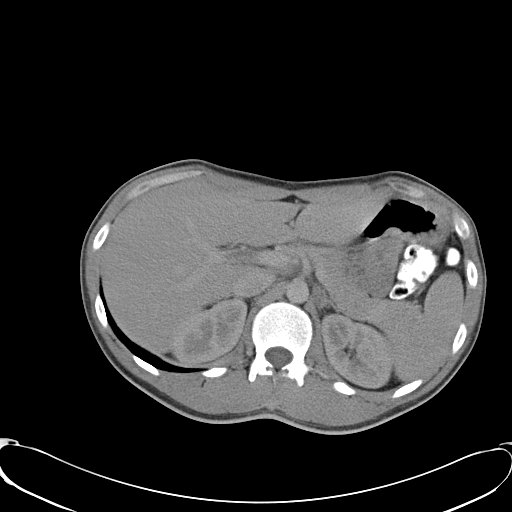
[im 71/81  soft-tissue]
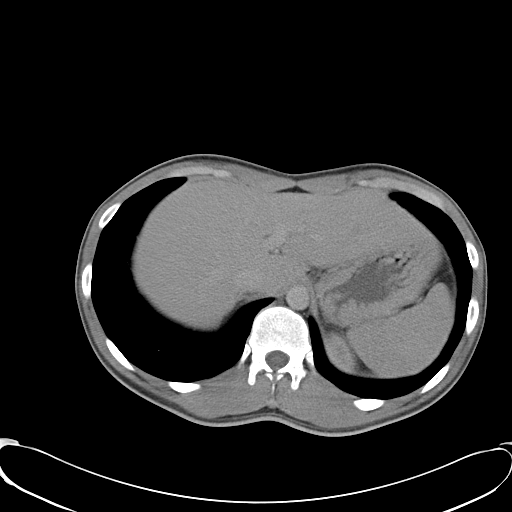
[im 77/81  soft-tissue]
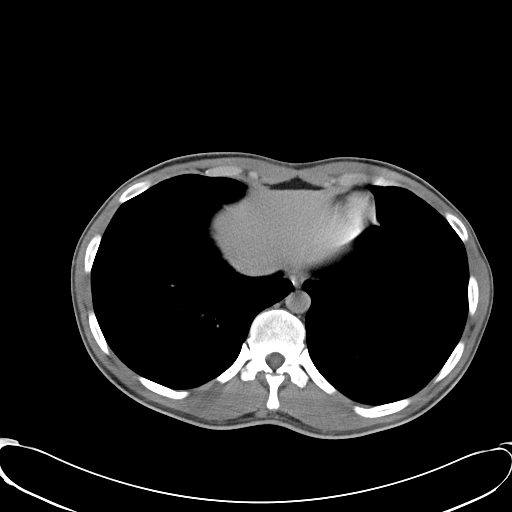

[Series 602: cor · coronal · 0.79mm/px · 3 of 90 slices shown]
[im 30/90  soft-tissue]
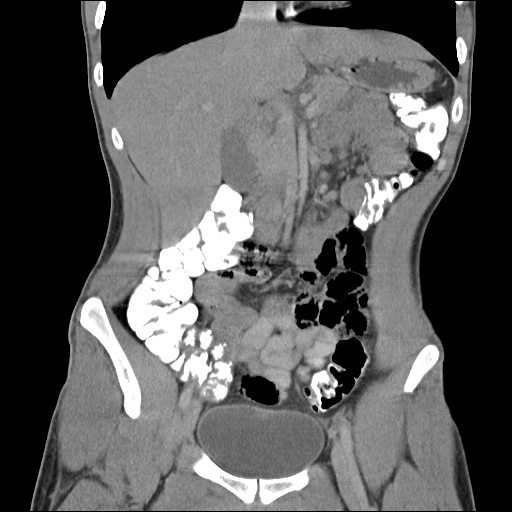
[im 40/90  soft-tissue]
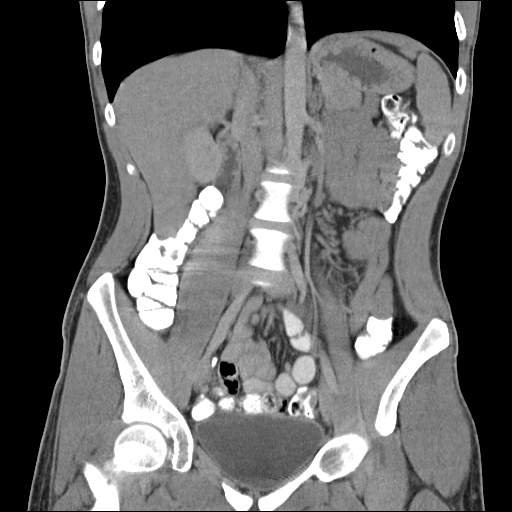
[im 50/90  soft-tissue]
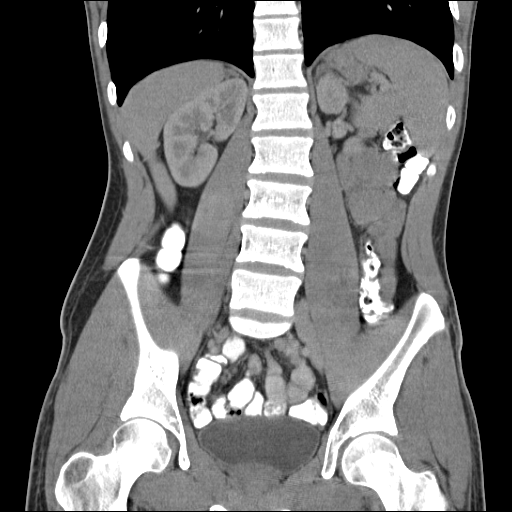

[17 of 46 positions shown; findings below may reference images not displayed]

FINDINGS: Lung bases clear.
Liver, spleen, pancreas, kidneys, and adrenal glands normal
appearance.
Differential attenuation in the gallbladder with slightly higher in
the dependent portions question sludge.
No intrahepatic biliary dilatation.
Normal appendix.
Stomach and bowel loops grossly normal appearance.
Oral contrast has passed to the rectum.
No mass, adenopathy, free fluid, or inflammatory process.
Unremarkable ureters and bladder without evidence of
ureterolithiasis.
Several small pelvic phleboliths noted.
No acute osseous findings.
IMPRESSION: Question sludge within gallbladder.
Otherwise negative exam.

Findings called to Dr. Tiger on 06/22/2011 at 7472 hours.

## 2016-09-01 ENCOUNTER — Ambulatory Visit (HOSPITAL_COMMUNITY)
Admission: EM | Admit: 2016-09-01 | Discharge: 2016-09-01 | Disposition: A | Discharge status: Home / Self Care | Payer: 59 | Attending: Internal Medicine | Admitting: Internal Medicine

## 2016-09-01 ENCOUNTER — Encounter (HOSPITAL_COMMUNITY): Payer: Self-pay | Admitting: Family Medicine

## 2016-09-01 DIAGNOSIS — Z202 Contact with and (suspected) exposure to infections with a predominantly sexual mode of transmission: Secondary | ICD-10-CM | POA: Insufficient documentation

## 2016-09-01 DIAGNOSIS — Z13818 Encounter for screening for other digestive system disorders: Secondary | ICD-10-CM | POA: Diagnosis not present

## 2016-09-01 DIAGNOSIS — F17200 Nicotine dependence, unspecified, uncomplicated: Secondary | ICD-10-CM | POA: Diagnosis not present

## 2016-09-01 DIAGNOSIS — B354 Tinea corporis: Secondary | ICD-10-CM | POA: Insufficient documentation

## 2016-09-01 DIAGNOSIS — Z1159 Encounter for screening for other viral diseases: Secondary | ICD-10-CM

## 2016-09-01 MED ORDER — FLUCONAZOLE 200 MG PO TABS: 200.0000 mg | ORAL_TABLET | Freq: Every day | ORAL | 0 refills | Status: AC

## 2016-09-01 NOTE — ED Triage Notes (Signed)
Pt here for possible exposure to Hep C. sts boyfriend tested positive and he wants to be checked.

## 2016-09-01 NOTE — Discharge Instructions (Addendum)
Test results should be available in the next 2-3 days.  Negative results will be available in MyChart; the urgent care will contact you about positive results.

## 2016-09-02 NOTE — ED Provider Notes (Signed)
[facility]    CSN: [id number] Arrival date & time: 09/01/16  1840     History   Chief Complaint Chief Complaint  Patient presents with  . Exposure to STD    HPI Daniel Cantrell is a 26 y.o. male. presents tonight with request for Hep C testing.  Was contacted by an ex-boyfriend recently who has been diagnosed with Hep C.  Equivocal fatigue.  No jaundice.  Also has several itchy round scaly patches on arms and trunk, not responding yet to otc anti fungal cream.    HPI  Past Medical History:  Diagnosis Date  . History of chicken pox     There are no active problems to display for this patient.   Past Surgical History:  Procedure Laterality Date  . NO PAST SURGERIES         Home Medications    Prior to Admission medications   Medication Sig Start Date End Date Taking? Authorizing Provider  fluconazole (DIFLUCAN) 200 MG tablet Take 1 tablet (200 mg total) by mouth daily. 09/01/16 09/08/16  Eustace Moore, MD    Family History Family History  Problem Relation Age of Onset  . Alcohol abuse Mother   . Cancer Maternal Grandmother 7    lung, pancreatic    Social History Social History  Substance Use Topics  . Smoking status: Current Every Day Smoker  . Smokeless tobacco: Never Used  . Alcohol use Yes     Allergies   Patient has no known allergies.   Review of Systems Review of Systems  All other systems reviewed and are negative.    Physical Exam Triage Vital Signs ED Triage Vitals [09/01/16 1938]  Enc Vitals Group     BP (!) 155/100     Pulse Rate (!) 113     Resp 18     Temp 98.7 F (37.1 C)     Temp Source Oral     SpO2 100 %     Weight      Height      Pain Score      Pain Loc    Updated Vital Signs BP (!) 155/100   Pulse (!) 113   Temp 98.7 F (37.1 C) (Oral)   Resp 18   SpO2 100%   Physical Exam  Constitutional: He is oriented to person, place, and time. No distress.  Alert, nicely groomed  HENT:  Head:  Atraumatic.  Eyes:  Conjugate gaze, no eye redness/drainage  Neck: Neck supple.  Cardiovascular: Normal rate.   Pulmonary/Chest: No respiratory distress.  Abdominal: He exhibits no distension.  Musculoskeletal: Normal range of motion.  Neurological: He is alert and oriented to person, place, and time.  Skin: Skin is warm and dry.  No cyanosis Itchy red patch with scale on right arm, 1.25" across, consistent with tinea.    Nursing note and vitals reviewed.    UC Treatments / Results  Labs Hepatitis panel (acute) ordered  Procedures Procedures (including critical care time) None today  Final Clinical Impressions(s) / UC Diagnoses   Final diagnoses:  Need for hepatitis C screening test  Tinea corporis   Test results should be available in the next 2-3 days.  Negative results will be available in MyChart; the urgent care will contact you about positive results.    New Prescriptions Discharge Medication List as of 09/01/2016  8:51 PM    START taking these medications   Details  fluconazole (DIFLUCAN) 200 MG tablet Take 1  tablet (200 mg total) by mouth daily., Starting Sat 09/01/2016, Until Sat 09/08/2016, Normal         Eustace Moore, MD 09/02/16 [phone]

## 2016-09-03 LAB — HEPATITIS PANEL, ACUTE
HEP A IGM: NEGATIVE
HEP B C IGM: NEGATIVE
HEP B S AG: NEGATIVE

## 2023-04-09 ENCOUNTER — Encounter (HOSPITAL_COMMUNITY): Payer: Self-pay | Admitting: Family Medicine

## 2023-04-29 ENCOUNTER — Other Ambulatory Visit: Payer: Self-pay

## 2023-04-29 ENCOUNTER — Encounter (HOSPITAL_COMMUNITY): Payer: Self-pay | Admitting: *Deleted

## 2023-04-29 ENCOUNTER — Emergency Department (HOSPITAL_COMMUNITY)
Admission: EM | Admit: 2023-04-29 | Discharge: 2023-04-29 | Disposition: A | Discharge status: Home / Self Care | Payer: 59 | Attending: Emergency Medicine | Admitting: Emergency Medicine

## 2023-04-29 DIAGNOSIS — Z2914 Encounter for prophylactic rabies immune globin: Secondary | ICD-10-CM | POA: Diagnosis not present

## 2023-04-29 DIAGNOSIS — Z23 Encounter for immunization: Secondary | ICD-10-CM | POA: Insufficient documentation

## 2023-04-29 DIAGNOSIS — Z203 Contact with and (suspected) exposure to rabies: Secondary | ICD-10-CM | POA: Insufficient documentation

## 2023-04-29 MED ORDER — RABIES IMMUNE GLOBULIN 150 UNIT/ML IM INJ
20.0000 [IU]/kg | INJECTION | Freq: Once | INTRAMUSCULAR | Status: AC
Start: 2023-04-29 — End: 2023-04-29
  Administered 2023-04-29: 1425 [IU]
  Filled 2023-04-29: qty 10

## 2023-04-29 MED ORDER — RABIES VACCINE, PCEC IM SUSR
1.0000 mL | Freq: Once | INTRAMUSCULAR | Status: AC
  Administered 2023-04-29: 1 mL via INTRAMUSCULAR
  Filled 2023-04-29: qty 1

## 2023-04-29 NOTE — ED Provider Notes (Signed)
[facility] Emergency Department Provider Note MRN:  [id number]  Arrival date & time: 04/29/23     Chief Complaint   wants a rabies test   History of Present Illness   Daniel Cantrell is a 32 y.o. year-old male with no pertinent past medical history presenting to the ED with chief complaint of rabies.  Patient becoming more more anxious about rabies.  Explains that he has 3 raccoons living on the roof of his porch.  Has known them since they were young.  Fed them a few times.  Was scratched about a month ago on the hand.  Has had no exposure to the animals since after learning about the risk of rabies.  Keeps thinking about rabies and is worried that he is going to get rabies, here for evaluation.  Denies any fever or symptoms other than some chapped lips recently.  Review of Systems  A thorough review of systems was obtained and all systems are negative except as noted in the HPI and PMH.   Patient's Health History    Past Medical History:  Diagnosis Date   History of chicken pox     Past Surgical History:  Procedure Laterality Date   NO PAST SURGERIES      Family History  Problem Relation Age of Onset   Alcohol abuse Mother    Cancer Maternal Grandmother 3       lung, pancreatic    Social History   Socioeconomic History   Marital status: Single    Spouse name: Not on file   Number of children: Not on file   Years of education: Not on file   Highest education level: Not on file  Occupational History   Not on file  Tobacco Use   Smoking status: Every Day   Smokeless tobacco: Never  Substance and Sexual Activity   Alcohol use: Yes   Drug use: No   Sexual activity: Not on file  Other Topics Concern   Not on file  Social History Narrative   ** Merged History Encounter **       Social Determinants of Health   Financial Resource Strain: Not on file  Food Insecurity: Not on file  Transportation Needs: Not on file  Physical Activity:  Not on file  Stress: Not on file  Social Connections: Not on file  Intimate Partner Violence: Not on file     Physical Exam   Vitals:   04/29/23 0122  BP: (!) 137/93  Pulse: (!) 121  Resp: 16  Temp: 98.9 F (37.2 C)  SpO2: 96%    CONSTITUTIONAL: Well-appearing, NAD NEURO/PSYCH:  Alert and oriented x 3, no focal deficits EYES:  eyes equal and reactive ENT/NECK:  no LAD, no JVD CARDIO: Regular rate, well-perfused, normal S1 and S2 PULM:  CTAB no wheezing or rhonchi GI/GU:  non-distended, non-tender MSK/SPINE:  No gross deformities, no edema SKIN:  no rash, atraumatic   *Additional and/or pertinent findings included in MDM below  Diagnostic and Interventional Summary    EKG Interpretation Date/Time:    Ventricular Rate:    PR Interval:    QRS Duration:    QT Interval:    QTC Calculation:   R Axis:      Text Interpretation:         Labs Reviewed - No data to display  No orders to display    Medications  rabies immune globulin (HYPERRAB/KEDRAB) injection 1,425 Units (has no administration in time range)  rabies vaccine (RABAVERT) injection 1 mL (has no administration in time range)     Procedures  /  Critical Care Procedures  ED Course and Medical Decision Making  Initial Impression and Ddx Patient is anxious likely explaining his tachycardia in triage, on my evaluation his heart rate is in the 90s.  He is not having any symptoms to suggest active rabies.  We discussed the likelihood of rabies, overall felt to be low.  However the incubation period of rabies is on average 2 to 3 months and sometimes over a year and so with his exposure and wound sustained 1 month ago he should be treated.  Past medical/surgical history that increases complexity of ED encounter: None  Interpretation of Diagnostics Laboratory and/or imaging options to aid in the diagnosis/care of the patient were considered.  After careful history and physical examination, it was determined  that there was no indication for diagnostics at this time.  Patient Reassessment and Ultimate Disposition/Management     Discharge  Patient management required discussion with the following services or consulting groups:  None  Complexity of Problems Addressed Acute complicated illness or Injury  Additional Data Reviewed and Analyzed Further history obtained from: None  Additional Factors Impacting ED Encounter Risk None  Elmer Sow. Pilar Plate, MD [facility] Emergency Medicine [facility] [email or web address]  Final Clinical Impressions(s) / ED Diagnoses     ICD-10-CM   1. Need for immunization against rabies  Z23       ED Discharge Orders     None        Discharge Instructions Discussed with and Provided to Patient:    Discharge Instructions      You were evaluated in the Emergency Department and after careful evaluation, we did not find any emergent condition requiring admission or further testing in the hospital.  Your exam/testing today was overall reassuring.  We recommend rabies vaccination.  You will need to complete the vaccine schedule which involves repeat vaccination on December 5, December 9, and December 16.  This can be done in the emergency department or likely an urgent care or the health department.  Please return to the Emergency Department if you experience any worsening of your condition.  Thank you for allowing [place] to be a part of your care.       Sabas Sous, MD 04/29/23 [phone]

## 2023-04-29 NOTE — ED Notes (Signed)
Pt in NAD at d/c from ED. A&O. Ambulatory. Respirations even & unlabored. Skin warm & dry. Pt verbalized understanding of d/c teaching including follow up care, rabies vaccine schedule and reasons to return to the ED. No needs or questions expressed at d/c.

## 2023-04-29 NOTE — Discharge Instructions (Signed)
You were evaluated in the Emergency Department and after careful evaluation, we did not find any emergent condition requiring admission or further testing in the hospital.  Your exam/testing today was overall reassuring.  We recommend rabies vaccination.  You will need to complete the vaccine schedule which involves repeat vaccination on December 5, December 9, and December 16.  This can be done in the emergency department or likely an urgent care or the health department.  Please return to the Emergency Department if you experience any worsening of your condition.  Thank you for allowing [place] to be a part of your care.

## 2023-04-29 NOTE — ED Triage Notes (Signed)
The pt wants a rabies test to see if he has rabies he feeds raccons every day and he has not been bitten  but he heard someone talk about those animals carrying rabies and that was one month ago  he has been having panic attacks about it since then.   He's afraid he has it  no symptoms except panic attacks

## 2023-05-02 ENCOUNTER — Ambulatory Visit (HOSPITAL_COMMUNITY)
Admission: EM | Admit: 2023-05-02 | Discharge: 2023-05-02 | Disposition: A | Discharge status: Home / Self Care | Payer: 59 | Attending: Family Medicine | Admitting: Family Medicine

## 2023-05-02 DIAGNOSIS — Z23 Encounter for immunization: Secondary | ICD-10-CM

## 2023-05-02 DIAGNOSIS — Z203 Contact with and (suspected) exposure to rabies: Secondary | ICD-10-CM

## 2023-05-02 MED ORDER — RABIES VACCINE, PCEC IM SUSR
INTRAMUSCULAR | Status: AC
  Filled 2023-05-02: qty 1

## 2023-05-02 MED ORDER — RABIES VACCINE, PCEC IM SUSR
1.0000 mL | Freq: Once | INTRAMUSCULAR | Status: AC
  Administered 2023-05-02: 1 mL via INTRAMUSCULAR

## 2023-05-02 NOTE — ED Triage Notes (Signed)
Presenting for day 3 rabies vaccine. Denies any reaction to the previous injection. States immunization was given in the left arm last time. Will administer in the right arm this dose.

## 2023-05-02 NOTE — ED Notes (Signed)
Rabies vaccine given in the right deltoid. Tolerated well.

## 2023-05-06 ENCOUNTER — Ambulatory Visit (HOSPITAL_COMMUNITY)
Admission: EM | Admit: 2023-05-06 | Discharge: 2023-05-06 | Disposition: A | Discharge status: Home / Self Care | Payer: 59 | Attending: Emergency Medicine | Admitting: Emergency Medicine

## 2023-05-06 ENCOUNTER — Other Ambulatory Visit: Payer: Self-pay

## 2023-05-06 ENCOUNTER — Encounter (HOSPITAL_COMMUNITY): Payer: Self-pay

## 2023-05-06 DIAGNOSIS — Z203 Contact with and (suspected) exposure to rabies: Secondary | ICD-10-CM

## 2023-05-06 MED ORDER — RABIES VACCINE, PCEC IM SUSR
INTRAMUSCULAR | Status: AC
  Filled 2023-05-06: qty 1

## 2023-05-06 MED ORDER — RABIES VACCINE, PCEC IM SUSR
1.0000 mL | Freq: Once | INTRAMUSCULAR | Status: AC
  Administered 2023-05-06: 1 mL via INTRAMUSCULAR

## 2023-05-06 MED ORDER — RABIES VACCINE, PCEC IM SUSR: 1.0000 mL | Freq: Once | INTRAMUSCULAR | Status: DC

## 2023-05-06 NOTE — ED Triage Notes (Signed)
Pt presents for 3rd rabies shot.

## 2023-05-13 ENCOUNTER — Ambulatory Visit (HOSPITAL_COMMUNITY)
Admission: EM | Admit: 2023-05-13 | Discharge: 2023-05-13 | Disposition: A | Discharge status: Home / Self Care | Payer: 59 | Attending: Internal Medicine | Admitting: Internal Medicine

## 2023-05-13 DIAGNOSIS — Z203 Contact with and (suspected) exposure to rabies: Secondary | ICD-10-CM

## 2023-05-13 MED ORDER — RABIES VACCINE, PCEC IM SUSR
INTRAMUSCULAR | Status: AC
  Filled 2023-05-13: qty 1

## 2023-05-13 MED ORDER — RABIES VACCINE, PCEC IM SUSR
1.0000 mL | Freq: Once | INTRAMUSCULAR | Status: AC
  Administered 2023-05-13: 1 mL via INTRAMUSCULAR

## 2023-07-31 ENCOUNTER — Encounter (HOSPITAL_COMMUNITY): Payer: Self-pay

## 2023-07-31 ENCOUNTER — Ambulatory Visit (HOSPITAL_COMMUNITY)
Admission: EM | Admit: 2023-07-31 | Discharge: 2023-07-31 | Disposition: A | Discharge status: Home / Self Care | Attending: Emergency Medicine | Admitting: Emergency Medicine

## 2023-07-31 DIAGNOSIS — Z113 Encounter for screening for infections with a predominantly sexual mode of transmission: Secondary | ICD-10-CM | POA: Insufficient documentation

## 2023-07-31 DIAGNOSIS — Z789 Other specified health status: Secondary | ICD-10-CM | POA: Diagnosis not present

## 2023-07-31 DIAGNOSIS — R1012 Left upper quadrant pain: Secondary | ICD-10-CM | POA: Insufficient documentation

## 2023-07-31 DIAGNOSIS — R1013 Epigastric pain: Secondary | ICD-10-CM | POA: Diagnosis not present

## 2023-07-31 DIAGNOSIS — R000 Tachycardia, unspecified: Secondary | ICD-10-CM | POA: Insufficient documentation

## 2023-07-31 LAB — POCT URINALYSIS DIP (MANUAL ENTRY)
Blood, UA: NEGATIVE
Glucose, UA: NEGATIVE mg/dL
Nitrite, UA: NEGATIVE
Protein Ur, POC: 100 mg/dL — AB
Spec Grav, UA: 1.025
Urobilinogen, UA: 1 U/dL
pH, UA: 6

## 2023-07-31 LAB — CBC
HCT: 57 % — ABNORMAL HIGH (ref 39.0–52.0)
Hemoglobin: 20.6 g/dL — ABNORMAL HIGH (ref 13.0–17.0)
MCH: 33.6 pg (ref 26.0–34.0)
MCHC: 36.1 g/dL — ABNORMAL HIGH (ref 30.0–36.0)
MCV: 93 fL (ref 80.0–100.0)
Platelets: 154 10*3/uL (ref 150–400)
RBC: 6.13 MIL/uL — ABNORMAL HIGH (ref 4.22–5.81)
RDW: 11.9 % (ref 11.5–15.5)
WBC: 9.2 10*3/uL (ref 4.0–10.5)
nRBC: 0 % (ref 0.0–0.2)

## 2023-07-31 LAB — HIV ANTIBODY (ROUTINE TESTING W REFLEX): HIV Screen 4th Generation wRfx: NONREACTIVE

## 2023-07-31 LAB — BASIC METABOLIC PANEL
Anion gap: 15 (ref 5–15)
BUN: <5 mg/dL — ABNORMAL LOW (ref 6–20)
CO2: 22 mmol/L (ref 22–32)
Calcium: 9.4 mg/dL (ref 8.9–10.3)
Chloride: 102 mmol/L (ref 98–111)
Creatinine, Ser: 0.8 mg/dL (ref 0.61–1.24)
GFR, Estimated: >60 mL/min (ref >60)
Glucose, Bld: 97 mg/dL (ref 70–99)
Potassium: 4.1 mmol/L (ref 3.5–5.1)
Sodium: 139 mmol/L (ref 135–145)

## 2023-07-31 LAB — LIPASE, BLOOD: Lipase: 41 U/L (ref 11–51)

## 2023-07-31 MED ORDER — PANTOPRAZOLE SODIUM 20 MG PO TBEC: 20.0000 mg | DELAYED_RELEASE_TABLET | Freq: Every day | ORAL | 0 refills | Status: AC

## 2023-07-31 MED ORDER — SUCRALFATE 1 G PO TABS: 1.0000 g | ORAL_TABLET | Freq: Three times a day (TID) | ORAL | 0 refills | Status: AC

## 2023-07-31 NOTE — ED Triage Notes (Signed)
 Patient states lower abdominal pain and diarrhea for several days. Denies any other symptoms.

## 2023-07-31 NOTE — ED Provider Notes (Addendum)
 [facility]    CSN: [id number] Arrival date & time: 07/31/23  1639      History   Chief Complaint Chief Complaint  Patient presents with   Abdominal Pain    HPI Daniel Cantrell is a 33 y.o. male.   Patient presents with intermittent left upper quadrant pain and burning.  Patient also endorses intermittent mild diarrhea.  Denies nausea, vomiting, blood in stool, and fever.  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  Patient also reports recent unprotected sexual intercourse and is requesting STD testing.  Denies any known exposures or related symptoms.   Abdominal Pain   Past Medical History:  Diagnosis Date   History of chicken pox     There are no active problems to display for this patient.   Past Surgical History:  Procedure Laterality Date   NO PAST SURGERIES         Home Medications    Prior to Admission medications   Medication Sig Start Date End Date Taking? Authorizing Provider  pantoprazole (PROTONIX) 20 MG tablet Take 1 tablet (20 mg total) by mouth daily. 07/31/23  Yes Susann Givens, Amica Harron A, NP  sucralfate (CARAFATE) 1 g tablet Take 1 tablet (1 g total) by mouth 4 (four) times daily -  with meals and at bedtime. 07/31/23  Yes Letta Kocher, NP    Family History Family History  Problem Relation Age of Onset   Alcohol abuse Mother    Cancer Maternal Grandmother 64       lung, pancreatic    Social History Social History   Tobacco Use   Smoking status: Every Day   Smokeless tobacco: Never  Substance Use Topics   Alcohol use: Yes   Drug use: No     Allergies   Patient has no known allergies.   Review of Systems Review of Systems  Gastrointestinal:  Positive for abdominal pain.   Per HPI  Physical Exam Triage Vital Signs ED Triage Vitals  Encounter Vitals Group     BP 07/31/23 1657 (!) 142/111     Systolic BP Percentile --      Diastolic BP Percentile --      Pulse  Rate 07/31/23 1657 (!) 129     Resp 07/31/23 1657 16     Temp 07/31/23 1657 98.4 F (36.9 C)     Temp Source 07/31/23 1657 Oral     SpO2 07/31/23 1657 94 %     Weight --      Height --      Head Circumference --      Peak Flow --      Pain Score 07/31/23 1658 5     Pain Loc --      Pain Education --      Exclude from Growth Chart --    No data found.  Updated Vital Signs BP (!) 143/104 (BP Location: Right Arm)   Pulse (!) 118   Temp 98.3 F (36.8 C) (Oral)   Resp 16   SpO2 95%   Visual Acuity Right Eye Distance:   Left Eye Distance:   Bilateral Distance:    Right Eye Near:   Left Eye Near:    Bilateral Near:     Physical Exam Vitals and nursing note reviewed.  Constitutional:      General: He is awake. He is not in acute distress.    Appearance: Normal appearance. He is  well-developed and well-groomed. He is not ill-appearing.  Cardiovascular:     Rate and Rhythm: Regular rhythm. Tachycardia present.  Pulmonary:     Effort: Pulmonary effort is normal.     Breath sounds: Normal breath sounds.  Abdominal:     General: Abdomen is flat. Bowel sounds are normal. There is no distension.     Palpations: Abdomen is soft.     Tenderness: There is abdominal tenderness in the epigastric area and left upper quadrant. There is no guarding or rebound.  Skin:    General: Skin is warm and dry.  Neurological:     Mental Status: He is alert and oriented to person, place, and time. Mental status is at baseline.     GCS: GCS eye subscore is 4. GCS verbal subscore is 5. GCS motor subscore is 6.  Psychiatric:        Attention and Perception: Attention and perception normal.        Mood and Affect: Affect normal. Mood is anxious.        Speech: Speech normal.        Behavior: Behavior normal. Behavior is cooperative.        Thought Content: Thought content normal.        Cognition and Memory: Cognition and memory normal.        Judgment: Judgment normal.      UC Treatments /  Results  Labs (all labs ordered are listed, but only abnormal results are displayed) Labs Reviewed  CBC - Abnormal; Notable for the following components:      Result Value   RBC 6.13 (*)    Hemoglobin 20.6 (*)    HCT 57.0 (*)    MCHC 36.1 (*)    All other components within normal limits  POCT URINALYSIS DIP (MANUAL ENTRY) - Abnormal; Notable for the following components:   Color, UA straw (*)    Bilirubin, UA small (*)    Ketones, POC UA small (15) (*)    Protein Ur, POC =100 (*)    Leukocytes, UA Trace (*)    All other components within normal limits  BASIC METABOLIC PANEL  LIPASE, BLOOD  HIV ANTIBODY (ROUTINE TESTING W REFLEX)  RPR  CYTOLOGY, (ORAL, ANAL, URETHRAL) ANCILLARY ONLY    EKG   Radiology No results found.  Procedures Procedures (including critical care time)  Medications Ordered in UC Medications - No data to display  Initial Impression / Assessment and Plan / UC Course  I have reviewed the triage vital signs and the nursing notes.  Pertinent labs & imaging results that were available during my care of the patient were reviewed by me and considered in my medical decision making (see chart for details).     Upon assessment mild left upper quadrant and epigastric tenderness noted upon palpation.  Tachycardia and mild hypertension noted.  Patient is mildly anxious.  Without tremors, headache, hallucinations, nausea, and vomiting.  Alert and oriented x 3.  GCS 15.  Patient reports drinking 8-12 beers per night.  Patient's last drink was last night.  CBC, BMP, and lipase ordered to rule out electrolyte imbalance, dehydration, and/or underlying pancreatitis that could be causing tachycardia and left upper quadrant pain.  GU exam deferred due to lack of symptoms.  Patient perform self swab for STD.  HIV and syphilis testing ordered.  UA ordered per standing orders.  UA did not reveal any obvious signs of infection.  Discussed symptoms of alcohol withdrawal.   Prescribed Protonix  and Carafate to help with abdominal discomfort and for possible underlying ulcer coverage.  Discussed follow-up, return, and strict ER precautions.  Follow-up @ 7:20pm: CBC resulted and showed elevated hemoglobin. Attempted x3 to call patient without answer to inform him of abnormal result and recommend patient be seen in the ER for further evaluation. Attempted to leave VM asking patient to call back, but VM box is full.  Final Clinical Impressions(s) / UC Diagnoses   Final diagnoses:  Left upper quadrant abdominal pain  Screening for STD (sexually transmitted disease)  Alcohol use  Abdominal pain, epigastric  Tachycardia     Discharge Instructions      Start taking Protonix and Carafate as prescribed to help with abdominal discomfort.  As discussed I recommend avoiding alcohol as this could be exacerbating your symptoms.  We have drawn some labs today and these results should come back in the next day and someone will call if there are any concerning results that require immediate treatment.  Your other results will return in the next 2 to 3 days.  Return here if symptoms persist or worsen.  You could also follow-up with [place] [facility] or at the mobile clinic if symptoms persist.  I have attached the mobile clinic schedule to the back of the paperwork.   If you develop worsening abdominal pain, severe weakness, tremors, increased anxiety, excessive vomiting, or seizure-like activity please seek immediate medical treatment in the ER.     ED Prescriptions     Medication Sig Dispense Auth. Provider   pantoprazole (PROTONIX) 20 MG tablet Take 1 tablet (20 mg total) by mouth daily. 30 tablet Wynonia Lawman A, NP   sucralfate (CARAFATE) 1 g tablet Take 1 tablet (1 g total) by mouth 4 (four) times daily -  with meals and at bedtime. 120 tablet Wynonia Lawman A, NP      PDMP not reviewed this encounter.   Letta Kocher,  NP 07/31/23 2014    Letta Kocher, NP 07/31/23 2015    Wynonia Lawman A, NP 08/01/23 [phone]

## 2023-07-31 NOTE — Discharge Instructions (Addendum)
 Start taking Protonix and Carafate as prescribed to help with abdominal discomfort.  As discussed I recommend avoiding alcohol as this could be exacerbating your symptoms.  We have drawn some labs today and these results should come back in the next day and someone will call if there are any concerning results that require immediate treatment.  Your other results will return in the next 2 to 3 days.  Return here if symptoms persist or worsen.  You could also follow-up with [place] [facility] or at the mobile clinic if symptoms persist.  I have attached the mobile clinic schedule to the back of the paperwork.   If you develop worsening abdominal pain, severe weakness, tremors, increased anxiety, excessive vomiting, or seizure-like activity please seek immediate medical treatment in the ER.

## 2023-08-01 LAB — CYTOLOGY, (ORAL, ANAL, URETHRAL) ANCILLARY ONLY
Chlamydia: NEGATIVE
Comment: NEGATIVE
Comment: NEGATIVE
Comment: NORMAL
Neisseria Gonorrhea: NEGATIVE
Trichomonas: NEGATIVE

## 2023-08-01 LAB — RPR: RPR Ser Ql: NONREACTIVE

## 2023-08-02 ENCOUNTER — Emergency Department (HOSPITAL_COMMUNITY)
Admission: EM | Admit: 2023-08-02 | Discharge: 2023-08-02 | Disposition: A | Discharge status: Home / Self Care | Attending: Emergency Medicine | Admitting: Emergency Medicine

## 2023-08-02 ENCOUNTER — Other Ambulatory Visit: Payer: Self-pay

## 2023-08-02 ENCOUNTER — Encounter (HOSPITAL_COMMUNITY): Payer: Self-pay | Admitting: Emergency Medicine

## 2023-08-02 DIAGNOSIS — R790 Abnormal level of blood mineral: Secondary | ICD-10-CM | POA: Diagnosis not present

## 2023-08-02 DIAGNOSIS — R109 Unspecified abdominal pain: Secondary | ICD-10-CM | POA: Insufficient documentation

## 2023-08-02 DIAGNOSIS — D751 Secondary polycythemia: Secondary | ICD-10-CM

## 2023-08-02 DIAGNOSIS — D45 Polycythemia vera: Secondary | ICD-10-CM | POA: Diagnosis not present

## 2023-08-02 LAB — URINALYSIS, ROUTINE W REFLEX MICROSCOPIC
Bilirubin Urine: NEGATIVE
Glucose, UA: NEGATIVE mg/dL
Hgb urine dipstick: NEGATIVE
Ketones, ur: NEGATIVE mg/dL
Nitrite: NEGATIVE
Protein, ur: NEGATIVE mg/dL
Specific Gravity, Urine: 1.023 (ref 1.005–1.030)
pH: 5 (ref 5.0–8.0)

## 2023-08-02 LAB — CBC WITH DIFFERENTIAL/PLATELET
Abs Immature Granulocytes: 0.05 10*3/uL (ref 0.00–0.07)
Basophils Absolute: 0 10*3/uL (ref 0.0–0.1)
Basophils Relative: 0 %
Eosinophils Absolute: 0.1 10*3/uL (ref 0.0–0.5)
Eosinophils Relative: 1 %
HCT: 52 % (ref 39.0–52.0)
Hemoglobin: 18.6 g/dL — ABNORMAL HIGH (ref 13.0–17.0)
Immature Granulocytes: 1 %
Lymphocytes Relative: 23 %
Lymphs Abs: 2.1 10*3/uL (ref 0.7–4.0)
MCH: 34 pg (ref 26.0–34.0)
MCHC: 35.8 g/dL (ref 30.0–36.0)
MCV: 95.1 fL (ref 80.0–100.0)
Monocytes Absolute: 1 10*3/uL (ref 0.1–1.0)
Monocytes Relative: 11 %
Neutro Abs: 5.7 10*3/uL (ref 1.7–7.7)
Neutrophils Relative %: 64 %
Platelets: 150 10*3/uL (ref 150–400)
RBC: 5.47 MIL/uL (ref 4.22–5.81)
RDW: 11.9 % (ref 11.5–15.5)
WBC: 8.9 10*3/uL (ref 4.0–10.5)
nRBC: 0.2 % (ref 0.0–0.2)

## 2023-08-02 LAB — COMPREHENSIVE METABOLIC PANEL
ALT: 32 U/L (ref 0–44)
AST: 41 U/L (ref 15–41)
Albumin: 4 g/dL (ref 3.5–5.0)
Alkaline Phosphatase: 73 U/L (ref 38–126)
Anion gap: 12 (ref 5–15)
BUN: 6 mg/dL (ref 6–20)
CO2: 23 mmol/L (ref 22–32)
Calcium: 8.9 mg/dL (ref 8.9–10.3)
Chloride: 101 mmol/L (ref 98–111)
Creatinine, Ser: 1 mg/dL (ref 0.61–1.24)
GFR, Estimated: >60 mL/min (ref >60)
Glucose, Bld: 116 mg/dL — ABNORMAL HIGH (ref 70–99)
Potassium: 3.8 mmol/L (ref 3.5–5.1)
Sodium: 136 mmol/L (ref 135–145)
Total Bilirubin: 1 mg/dL (ref 0.0–1.2)
Total Protein: 6.9 g/dL (ref 6.5–8.1)

## 2023-08-02 LAB — LIPASE, BLOOD: Lipase: 64 U/L — ABNORMAL HIGH (ref 11–51)

## 2023-08-02 NOTE — ED Provider Triage Note (Signed)
 Emergency Medicine Provider Triage Evaluation Note  Daniel Cantrell , a 33 y.o. male  was evaluated in triage.  Pt complains of abnormal labs.  Patient reports he went to urgent care 2 days ago for left upper quadrant pain and burning sensation.  They are treating him for stomach ulcers which with improvement of symptoms.  He was called saying that his hemoglobin level was elevated he needs to go to the ED for further evaluation.  Denies any complaints or concerns at this time.  Review of Systems  Positive: Hemoglobin levels Negative: CP, SOB  Physical Exam  BP (!) 147/93 (BP Location: Left Arm)   Pulse 97   Temp 98.2 F (36.8 C)   Resp 16   Wt 72 kg   SpO2 97%   BMI 22.78 kg/m  Gen:   Awake, no distress  Resp:  Normal effort  MSK:   Moves extremities without difficulty  Other:    Medical Decision Making  Medically screening exam initiated at 7:58 PM.  Appropriate orders placed.  Daniel Cantrell was informed that the remainder of the evaluation will be completed by another provider, this initial triage assessment does not replace that evaluation, and the importance of remaining in the ED until their evaluation is complete.     Daniel Marion, PA-C 08/02/23 [phone]

## 2023-08-02 NOTE — ED Triage Notes (Signed)
 Pt in with continued abdominal pain, seen at [facility] yesterday. Pt was called to come to ED after abnormal labs resulted - Hg 20.

## 2023-08-02 NOTE — ED Provider Notes (Signed)
 [place] [facility] AT [facility] Provider Note   CSN: [id number] Arrival date & time: 08/02/23  1930     History  Chief Complaint  Patient presents with   Abdominal Pain   Abnormal Lab    Daniel Cantrell is a 33 y.o. male.  Significant PMH.  Presents the ER for evaluation of high hemoglobin level.  Was at urgent care 2 days ago complains of abdominal pain that he thought was related to drinking alcohol, had not been eating or drinking he was having some diarrhea as well.  They called him today and told him his hemoglobin is high to come to the ER.  He states feeling much better in the ED and drinking normally since then.  No chest pain or shortness of breath.  No headaches, no numbness ting or weakness.     Abdominal Pain Abnormal Lab      Home Medications Prior to Admission medications   Medication Sig Start Date End Date Taking? Authorizing Provider  pantoprazole (PROTONIX) 20 MG tablet Take 1 tablet (20 mg total) by mouth daily. 07/31/23   Wynonia Lawman A, NP  sucralfate (CARAFATE) 1 g tablet Take 1 tablet (1 g total) by mouth 4 (four) times daily -  with meals and at bedtime. 07/31/23   Letta Kocher, NP      Allergies    Patient has no known allergies.    Review of Systems   Review of Systems  Gastrointestinal:  Positive for abdominal pain.    Physical Exam Updated Vital Signs BP (!) 147/93 (BP Location: Left Arm)   Pulse 97   Temp 98.2 F (36.8 C)   Resp 16   Wt 72 kg   SpO2 97%   BMI 22.78 kg/m  Physical Exam Vitals and nursing note reviewed.  Constitutional:      General: He is not in acute distress.    Appearance: He is well-developed.  HENT:     Head: Normocephalic and atraumatic.  Eyes:     Conjunctiva/sclera: Conjunctivae normal.  Cardiovascular:     Rate and Rhythm: Normal rate and regular rhythm.     Heart sounds: No murmur heard. Pulmonary:     Effort: Pulmonary effort is normal. No respiratory distress.      Breath sounds: Normal breath sounds.  Abdominal:     Palpations: Abdomen is soft.     Tenderness: There is no abdominal tenderness.  Musculoskeletal:        General: No swelling.     Cervical back: Neck supple.  Skin:    General: Skin is warm and dry.     Capillary Refill: Capillary refill takes less than 2 seconds.  Neurological:     General: No focal deficit present.     Mental Status: He is alert and oriented to person, place, and time.  Psychiatric:        Mood and Affect: Mood normal.     ED Results / Procedures / Treatments   Labs (all labs ordered are listed, but only abnormal results are displayed) Labs Reviewed  CBC WITH DIFFERENTIAL/PLATELET - Abnormal; Notable for the following components:      Result Value   Hemoglobin 18.6 (*)    All other components within normal limits  LIPASE, BLOOD - Abnormal; Notable for the following components:   Lipase 64 (*)    All other components within normal limits  COMPREHENSIVE METABOLIC PANEL - Abnormal; Notable for the following components:   Glucose,  Bld 116 (*)    All other components within normal limits  URINALYSIS, ROUTINE W REFLEX MICROSCOPIC - Abnormal; Notable for the following components:   Color, Urine AMBER (*)    Leukocytes,Ua TRACE (*)    Bacteria, UA RARE (*)    All other components within normal limits    EKG None  Radiology No results found.  Procedures Procedures    Medications Ordered in ED Medications - No data to display  ED Course/ Medical Decision Making/ A&P                                 Medical Decision Making Dx: Dehydration, polycythemia, gastritis, pancreatitis, other  ED course: Patient comes in for evaluation of abnormal hemoglobin with hemoglobin of 22 days ago urgent care at the time was having GI symptoms, has been eating and drinking normally since then hemoglobin down to 18.6 today.  Patient is completely asymptomatic at this time, his abdominal pain has resolved.  He  feels it was related to alcohol use.  His hemoglobin of 20 is likely related to hemoconcentration, it is improving.  He was advised to drink plenty of fluids and follow-up with PCP.  He states he just got Medicaid so he will be able to follow-up.  He was given strict return precautions.           Final Clinical Impression(s) / ED Diagnoses Final diagnoses:  None    Rx / DC Orders ED Discharge Orders     None         Ma Rings, PA-C 08/02/23 2236    Virgina Norfolk, DO 08/02/23 2246

## 2023-08-02 NOTE — Discharge Instructions (Addendum)
 It was a pleasure taking care of you today.  You were evaluated in the ER for high hemoglobin.  Your results showed hemoglobin is still slightly high but has improved.  Make sure you drink plenty of water and stay hydrated and have this rechecked by your PCP.  You were given information to follow-up with the [facility].  It is important to follow-up closely with your primary care doctor and/or any specialist as discussed.  Come back to the ER if you have any new or worsening symptoms.
